# Patient Record
Sex: Male | Born: 1985 | Race: Black or African American | Hispanic: No | Marital: Married | Smoking: Never smoker
Health system: Southern US, Community
[De-identification: ages and names within clinical notes are randomized; demographics above are authoritative.]

---
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
## 2011-02-05 ENCOUNTER — Emergency Department (HOSPITAL_COMMUNITY)
Admission: EM | Admit: 2011-02-05 | Discharge: 2011-02-05 | Disposition: A | Discharge status: Home / Self Care | Payer: No Typology Code available for payment source | Attending: Emergency Medicine | Admitting: Emergency Medicine

## 2011-02-05 ENCOUNTER — Encounter (HOSPITAL_COMMUNITY): Payer: Self-pay | Admitting: *Deleted

## 2011-02-05 ENCOUNTER — Emergency Department (HOSPITAL_COMMUNITY): Payer: No Typology Code available for payment source

## 2011-02-05 DIAGNOSIS — T1490XA Injury, unspecified, initial encounter: Secondary | ICD-10-CM | POA: Insufficient documentation

## 2011-02-05 DIAGNOSIS — M542 Cervicalgia: Secondary | ICD-10-CM | POA: Insufficient documentation

## 2011-02-05 DIAGNOSIS — IMO0001 Reserved for inherently not codable concepts without codable children: Secondary | ICD-10-CM | POA: Insufficient documentation

## 2011-02-05 DIAGNOSIS — Y92410 Unspecified street and highway as the place of occurrence of the external cause: Secondary | ICD-10-CM | POA: Insufficient documentation

## 2011-02-05 MED ORDER — DIAZEPAM 5 MG PO TABS: 5.0000 mg | ORAL_TABLET | Freq: Three times a day (TID) | ORAL | Status: AC | PRN

## 2011-02-05 MED ORDER — IBUPROFEN 800 MG PO TABS
800.0000 mg | ORAL_TABLET | Freq: Once | ORAL | Status: AC
  Administered 2011-02-05: 800 mg via ORAL
  Filled 2011-02-05: qty 1

## 2011-02-05 MED ORDER — IBUPROFEN 800 MG PO TABS: 800.0000 mg | ORAL_TABLET | Freq: Three times a day (TID) | ORAL | Status: AC | PRN

## 2011-02-05 MED ORDER — OXYCODONE-ACETAMINOPHEN 5-325 MG PO TABS: 1.0000 | ORAL_TABLET | Freq: Four times a day (QID) | ORAL | Status: AC | PRN

## 2011-02-05 MED ORDER — OXYCODONE-ACETAMINOPHEN 5-325 MG PO TABS
1.0000 | ORAL_TABLET | Freq: Once | ORAL | Status: AC
  Administered 2011-02-05: 1 via ORAL
  Filled 2011-02-05: qty 1

## 2011-02-05 NOTE — ED Notes (Signed)
Patient verbalized complete understanding of d/c instructions
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
## 2011-02-05 NOTE — ED Provider Notes (Signed)
Medical screening examination/treatment/procedure(s) were performed by non-physician practitioner and as supervising physician I was immediately available for consultation/collaboration.   Andrew King, MD 02/05/11 2339 
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
## 2017-12-12 HISTORY — PX: WISDOM TOOTH EXTRACTION: SHX21

## 2017-12-31 ENCOUNTER — Encounter: Payer: Self-pay | Admitting: Family Medicine

## 2017-12-31 ENCOUNTER — Ambulatory Visit (INDEPENDENT_AMBULATORY_CARE_PROVIDER_SITE_OTHER): Payer: Managed Care, Other (non HMO) | Admitting: Family Medicine

## 2017-12-31 VITALS — BP 120/80 | HR 95 | Temp 98.0°F | Resp 12 | Ht 71.0 in | Wt 284.9 lb

## 2017-12-31 DIAGNOSIS — Z1159 Encounter for screening for other viral diseases: Secondary | ICD-10-CM | POA: Diagnosis not present

## 2017-12-31 DIAGNOSIS — Z6839 Body mass index (BMI) 39.0-39.9, adult: Secondary | ICD-10-CM

## 2017-12-31 DIAGNOSIS — Z13220 Encounter for screening for lipoid disorders: Secondary | ICD-10-CM

## 2017-12-31 DIAGNOSIS — E6609 Other obesity due to excess calories: Secondary | ICD-10-CM

## 2017-12-31 DIAGNOSIS — Z114 Encounter for screening for human immunodeficiency virus [HIV]: Secondary | ICD-10-CM

## 2017-12-31 DIAGNOSIS — Z23 Encounter for immunization: Secondary | ICD-10-CM
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
## 2018-01-02 ENCOUNTER — Encounter: Payer: Self-pay | Admitting: Family Medicine

## 2018-01-02 DIAGNOSIS — E66812 Obesity, class 2: Secondary | ICD-10-CM | POA: Insufficient documentation

## 2018-01-02 DIAGNOSIS — E785 Hyperlipidemia, unspecified: Secondary | ICD-10-CM | POA: Insufficient documentation

## 2018-01-02 DIAGNOSIS — Z6837 Body mass index (BMI) 37.0-37.9, adult: Secondary | ICD-10-CM | POA: Insufficient documentation

## 2018-01-02 DIAGNOSIS — E6609 Other obesity due to excess calories: Secondary | ICD-10-CM | POA: Insufficient documentation

## 2018-01-02 DIAGNOSIS — Z6839 Body mass index (BMI) 39.0-39.9, adult: Secondary | ICD-10-CM

## 2018-01-03 LAB — COMPLETE METABOLIC PANEL WITH GFR
AG RATIO: 1.5 (calc) (ref 1.0–2.5)
ALT: 17 U/L (ref 9–46)
AST: 16 U/L (ref 10–40)
Albumin: 4.5 g/dL (ref 3.6–5.1)
Alkaline phosphatase (APISO): 82 U/L (ref 40–115)
BUN: 16 mg/dL (ref 7–25)
CALCIUM: 10.2 mg/dL (ref 8.6–10.3)
CO2: 19 mmol/L — ABNORMAL LOW (ref 20–32)
Chloride: 104 mmol/L (ref 98–110)
Creat: 1.26 mg/dL (ref 0.60–1.35)
GFR, EST AFRICAN AMERICAN: 88 mL/min/{1.73_m2} (ref >=60)
GFR, EST NON AFRICAN AMERICAN: 76 mL/min/{1.73_m2} (ref >=60)
Globulin: 3.1 g/dL (calc) (ref 1.9–3.7)
Glucose, Bld: 83 mg/dL (ref 65–99)
POTASSIUM: 4.8 mmol/L (ref 3.5–5.3)
Sodium: 143 mmol/L (ref 135–146)
TOTAL PROTEIN: 7.6 g/dL (ref 6.1–8.1)
Total Bilirubin: 0.7 mg/dL (ref 0.2–1.2)

## 2018-01-03 LAB — LIPID PANEL
CHOL/HDL RATIO: 3.2 (calc) (ref <5.0)
Cholesterol: 204 mg/dL — ABNORMAL HIGH (ref <200)
HDL: 63 mg/dL (ref >40)
LDL CHOLESTEROL (CALC): 118 mg/dL — AB
NON-HDL CHOLESTEROL (CALC): 141 mg/dL — AB (ref <130)
TRIGLYCERIDES: 121 mg/dL (ref <150)

## 2018-01-03 LAB — HEPATITIS C ANTIBODY
Hepatitis C Ab: NONREACTIVE
SIGNAL TO CUT-OFF: 0.04 (ref <1.00)

## 2018-01-03 LAB — HIV ANTIBODY (ROUTINE TESTING W REFLEX): HIV: NONREACTIVE

## 2018-01-03 LAB — TEST AUTHORIZATION

## 2018-01-03 LAB — TSH: TSH: 0.76 mIU/L (ref 0.40–4.50)

## 2018-02-28 ENCOUNTER — Encounter: Payer: Managed Care, Other (non HMO) | Admitting: Family Medicine

## 2018-03-03 ENCOUNTER — Ambulatory Visit (INDEPENDENT_AMBULATORY_CARE_PROVIDER_SITE_OTHER): Payer: Managed Care, Other (non HMO) | Admitting: Family Medicine

## 2018-03-03 ENCOUNTER — Encounter: Payer: Self-pay | Admitting: Family Medicine

## 2018-03-03 VITALS — BP 120/80 | HR 85 | Temp 97.9°F | Resp 16 | Ht 71.0 in | Wt 272.8 lb

## 2018-03-03 DIAGNOSIS — Z0000 Encounter for general adult medical examination without abnormal findings: Secondary | ICD-10-CM | POA: Diagnosis not present

## 2018-03-03 NOTE — Progress Notes (Signed)
Name: Steven Braun   MRN: [id number]    DOB: [birth year]   Date:03/03/2018       Progress Note  Subjective  Chief Complaint  Chief Complaint  Patient presents with  . Annual Exam    HPI  Patient presents for annual CPE.  USPSTF grade A and B recommendations:  Diet: Eating low carb mostly, cheats sometimes, drinking more water.  Exercise: Going to [employer] in [place] and is down 14lbs.   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  Depression screen [facility] 2/9 03/03/2018 12/31/2017  Decreased Interest 0 0  Down, Depressed, Hopeless 1 1  PHQ - 2 Score 1 1  Altered sleeping 0 0  Tired, decreased energy 0 0  Change in appetite 0 1  Feeling bad or failure about yourself  0 1  Trouble concentrating 0 0  Moving slowly or fidgety/restless 0 0  Suicidal thoughts - 0  PHQ-9 Score 1 3  Difficult doing work/chores Somewhat difficult Not difficult at all     Office Visit from 03/03/2018 in [facility]  AUDIT-C Score  2    1-2 times a month at most.  Hypertension:  BP Readings from Last 3 Encounters:  03/03/18 120/80  12/31/17 120/80  02/05/11 147/81   Obesity: Wt Readings from Last 3 Encounters:  03/03/18 272 lb 12.8 oz (123.7 kg)  12/31/17 284 lb 14.4 oz (129.2 kg)  02/05/11 190 lb (86.2 kg)   BMI Readings from Last 3 Encounters:  03/03/18 38.05 kg/m  12/31/17 39.74 kg/m  02/05/11 25.07 kg/m    Lipids:  Lab Results  Component Value Date   CHOL 204 (H) 12/31/2017   Lab Results  Component Value Date   HDL 63 12/31/2017   Lab Results  Component Value Date   LDLCALC 118 (H) 12/31/2017   Lab Results  Component Value Date   TRIG 121 12/31/2017   Lab Results  Component Value Date   CHOLHDL 3.2 12/31/2017   No results found for: LDLDIRECT Glucose:  Glucose, Bld  Date Value Ref Range Status  12/31/2017 83 65 - 99 mg/dL Final    Comment:    .            Fasting reference interval .     Married STD testing and prevention (HIV/chl/gon/syphilis): Hep C and HIV was negative in 2019.  Declines gonorrhea/chlamydia testing.   Hep C: Negative in 2019  Skin cancer: No concerning lesions Colorectal cancer: Denies family or personal history of colorectal cancer, no changes in BM's - no blood in stool, dark and tarry stool, mucus in stool, or constipation/diarrhea. Prostate cancer: No known family history. Discussed signs and symptoms of enlarged prostate, he denies symptoms.   No results found for: PSA  Lung cancer: Never smoker.  Low Dose CT Chest recommended if Age 36-80 years, 30 pack-year currently smoking OR have quit w/in 15years. Patient does not qualify.   AAA: The USPSTF recommends one-time screening with ultrasonography in men ages 72 to 32 years who have ever smoked ECG:  Denies chest pain, shortness of breath, palpitations.   Advanced Care Planning: A voluntary discussion about advance care planning including the explanation and discussion of advance directives.  Discussed health care proxy and Living will, and the patient was able to identify a health care proxy as Alejandro Mulling.  Patient does not have a living will at present time. If patient does have living will, I  have requested they bring this to the clinic to be scanned in to their chart.  Patient Active Problem List   Diagnosis Date Noted  . Hyperlipidemia 01/02/2018  . Class 2 obesity due to excess calories without serious comorbidity with body mass index (BMI) of 39.0 to 39.9 in adult 01/02/2018    Past Surgical History:  Procedure Laterality Date  . WISDOM TOOTH EXTRACTION  12/12/2017    Family History  Problem Relation Age of Onset  . Breast cancer Mother 59       Unsure if genetic testing was done  . Congestive Heart Failure Paternal Grandmother     Social History   Socioeconomic History  . Marital status: Married    Spouse name: Paris  . Number of children: Not on file  . Years of education:  Bachelor's  . Highest education level: Bachelor's degree (e.g., BA, AB, BS)  Occupational History  . Not on file  Social Needs  . Financial resource strain: Not on file  . Food insecurity:    Worry: Not on file    Inability: Not on file  . Transportation needs:    Medical: Not on file    Non-medical: Not on file  Tobacco Use  . Smoking status: Never Smoker  . Smokeless tobacco: Never Used  Substance and Sexual Activity  . Alcohol use: Yes    Comment: 1-2 times a month  . Drug use: No  . Sexual activity: Yes    Partners: Female  Lifestyle  . Physical activity:    Days per week: Not on file    Minutes per session: Not on file  . Stress: Not on file  Relationships  . Social connections:    Talks on phone: Not on file    Gets together: Not on file    Attends religious service: Not on file    Active member of club or organization: Not on file    Attends meetings of clubs or organizations: Not on file    Relationship status: Not on file  . Intimate partner violence:    Fear of current or ex partner: Not on file    Emotionally abused: Not on file    Physically abused: Not on file    Forced sexual activity: Not on file  Other Topics Concern  . 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      Current Outpatient Medications:  .  acetaminophen (TYLENOL) 500 MG tablet, Take 500 mg by mouth every 6 (six) hours as needed. For pain relef, Disp: , Rfl:  .  DM-Phenylephrine-Acetaminophen (TYLENOL COLD MULTI-SYMPTOM) 10-5-325 MG/15ML LIQD, Take 15 mLs by mouth every 6 (six) hours as needed. For symptom relief, Disp: , Rfl:   No Known Allergies   ROS  Constitutional: Negative for fever or weight change.  Respiratory: Negative for cough and shortness of breath.   Cardiovascular: Negative for chest pain or palpitations.  Gastrointestinal: Negative for abdominal pain, no bowel changes.  Musculoskeletal: Negative for gait problem or  joint swelling.  Skin: Negative for rash.  Neurological: Negative for dizziness or headache.  No other specific complaints in a complete review of systems (except as listed in HPI above.   Objective  Vitals:   03/03/18 1050  BP: 120/80  Pulse: 85  Resp: 16  Temp: 97.9 F (36.6 C)  TempSrc: Oral  SpO2: 98%  Weight: 272 lb 12.8 oz (123.7 kg)  Height: 5\' 11"  (1.803  m)    Body mass index is 38.05 kg/m.  Physical Exam  Constitutional: Patient appears well-developed and well-nourished. No distress.  HENT: Head: Normocephalic and atraumatic. Ears: B TMs ok, no erythema or effusion; Nose: Nose normal. Mouth/Throat: Oropharynx is clear and moist. No oropharyngeal exudate.  Eyes: Conjunctivae and EOM are normal. Pupils are equal, round, and reactive to light. No scleral icterus.  Neck: Normal range of motion. Neck supple. No JVD present. No thyromegaly present.  Cardiovascular: Normal rate, regular rhythm and normal heart sounds.  No murmur heard. No BLE edema. Pulmonary/Chest: Effort normal and breath sounds normal. No respiratory distress. Abdominal: Soft. Bowel sounds are normal, no distension. There is no tenderness. no masses MALE GENITALIA: Deferred RECTAL: Deferred Musculoskeletal: Normal range of motion, no joint effusions. No gross deformities Neurological: he is alert and oriented to person, place, and time. No cranial nerve deficit. Coordination, balance, strength, speech and gait are normal.  Skin: Skin is warm and dry. No rash noted. No erythema.  Psychiatric: Patient has a normal mood and affect. behavior is normal. Judgment and thought content normal.  Recent Results (from the past 2160 hour(s))  HIV Antibody (routine testing w rflx)     Status: None   Collection Time: 12/31/17  9:13 AM  Result Value Ref Range   HIV 1&2 Ab, 4th Generation NON-REACTIVE NON-REACTI    Comment: HIV-1 antigen and HIV-1/HIV-2 antibodies were not detected. There is no laboratory evidence  of HIV infection. Marland Kitchen PLEASE NOTE: This information has been disclosed to you from records whose confidentiality may be protected by state law.  If your state requires such protection, then the state law prohibits you from making any further disclosure of the information without the specific written consent of the person to whom it pertains, or as otherwise permitted by law. A general authorization for the release of medical or other information is NOT sufficient for this purpose. . For additional information please refer to http://education.[facility].com/faq/FAQ106 (This link is being provided for informational/ educational purposes only.) . Marland Kitchen The performance of this assay has not been clinically validated in patients less than 33 years old. .   Hepatitis C antibody     Status: None   Collection Time: 12/31/17  9:13 AM  Result Value Ref Range   Hepatitis C Ab NON-REACTIVE NON-REACTI   SIGNAL TO CUT-OFF 0.04 <1.00    Comment: . HCV antibody was non-reactive. There is no laboratory  evidence of HCV infection. . In most cases, no further action is required. However, if recent HCV exposure is suspected, a test for HCV RNA (test code 74128) is suggested. . For additional information please refer to http://education.[facility].com/faq/FAQ22v1 (This link is being provided for informational/ educational purposes only.) .   Lipid panel     Status: Abnormal   Collection Time: 12/31/17  9:13 AM  Result Value Ref Range   Cholesterol 204 (H) <200 mg/dL   HDL 63 >78 mg/dL   Triglycerides 676 <720 mg/dL   LDL Cholesterol (Calc) 118 (H) mg/dL (calc)    Comment: Reference range: <100 . Desirable range <100 mg/dL for primary prevention;   <70 mg/dL for patients with CHD or diabetic patients  with > or = 2 CHD risk factors. Marland Kitchen LDL-C is now calculated using the Martin-Hopkins  calculation, which is a validated novel method providing  better accuracy than the Friedewald  equation in the  estimation of LDL-C.  Horald Pollen et al. Lenox Ahr. 9470;962(83): 2061-2068  (http://education.[facility].com/faq/FAQ164)    Total CHOL/HDL  Ratio 3.2 <5.0 (calc)   Non-HDL Cholesterol (Calc) 141 (H) <130 mg/dL (calc)    Comment: For patients with diabetes plus 1 major ASCVD risk  factor, treating to a non-HDL-C goal of <100 mg/dL  (LDL-C of <95 mg/dL) is considered a therapeutic  option.   TSH     Status: None   Collection Time: 12/31/17  9:13 AM  Result Value Ref Range   TSH 0.76 0.40 - 4.50 mIU/L  COMPLETE METABOLIC PANEL WITH GFR     Status: Abnormal   Collection Time: 12/31/17  9:13 AM  Result Value Ref Range   Glucose, Bld 83 65 - 99 mg/dL    Comment: .            Fasting reference interval .    BUN 16 7 - 25 mg/dL   Creat 2.84 1.32 - 4.40 mg/dL   GFR, Est Non African American 76 > OR = 60 mL/min/1.73m2   GFR, Est African American 88 > OR = 60 mL/min/1.41m2   BUN/Creatinine Ratio NOT APPLICABLE 6 - 22 (calc)   Sodium 143 135 - 146 mmol/L   Potassium 4.8 3.5 - 5.3 mmol/L   Chloride 104 98 - 110 mmol/L   CO2 19 (L) 20 - 32 mmol/L   Calcium 10.2 8.6 - 10.3 mg/dL   Total Protein 7.6 6.1 - 8.1 g/dL   Albumin 4.5 3.6 - 5.1 g/dL   Globulin 3.1 1.9 - 3.7 g/dL (calc)   AG Ratio 1.5 1.0 - 2.5 (calc)   Total Bilirubin 0.7 0.2 - 1.2 mg/dL   Alkaline phosphatase (APISO) 82 40 - 115 U/L   AST 16 10 - 40 U/L   ALT 17 9 - 46 U/L  TEST AUTHORIZATION     Status: None   Collection Time: 12/31/17  9:13 AM  Result Value Ref Range   TEST NAME: COMPREHENSIVE METABOLIC    TEST CODE: [id number]    CLIENT CONTACT: LESLIE SMITH    REPORT ALWAYS MESSAGE SIGNATURE      Comment: . The laboratory testing on this patient was verbally requested or confirmed by the ordering physician or his or her authorized representative after contact with an employee of [employer]. Federal regulations require that we maintain on file written authorization for all laboratory testing.   Accordingly we are asking that the ordering physician or his or her authorized representative sign a copy of this report and promptly return it to the client service representative. . . Signature:____________________________________________________ . Please fax this signed page to [phone] or return it via your [employer] courier.      PHQ2/9: Depression screen [facility] 2/9 03/03/2018 12/31/2017  Decreased Interest 0 0  Down, Depressed, Hopeless 1 1  PHQ - 2 Score 1 1  Altered sleeping 0 0  Tired, decreased energy 0 0  Change in appetite 0 1  Feeling bad or failure about yourself  0 1  Trouble concentrating 0 0  Moving slowly or fidgety/restless 0 0  Suicidal thoughts - 0  PHQ-9 Score 1 3  Difficult doing work/chores Somewhat difficult Not difficult at all    Fall Risk: Fall Risk  03/03/2018 03/03/2018 12/31/2017  Falls in the past year? 0 0 0  Number falls in past yr: 0 0 0  Injury with Fall? 0 0 0    Functional Status Survey: Is the patient deaf or have difficulty hearing?: No Does the patient have difficulty seeing, even when wearing glasses/contacts?: No Does the patient have difficulty concentrating,  remembering, or making decisions?: No Does the patient have difficulty walking or climbing stairs?: No Does the patient have difficulty dressing or bathing?: No Does the patient have difficulty doing errands alone such as visiting a doctor's office or shopping?: No  Assessment & Plan  1. Annual physical exam -Prostate cancer screening and PSA options (with potential risks and benefits of testing vs not testing) were discussed along with recent recs/guidelines. -USPSTF grade A and B recommendations reviewed with patient; age-appropriate recommendations, preventive care, screening tests, etc discussed and encouraged; healthy living encouraged; see AVS for patient education given to patient -Discussed importance of 150 minutes of physical activity weekly, eat two  servings of fish weekly, eat one serving of tree nuts ( cashews, pistachios, pecans, almonds.Marland Kitchen) every other day, eat 6 servings of fruit/vegetables daily and drink plenty of water and avoid sweet beverages.

## 2018-03-03 NOTE — Patient Instructions (Signed)
Preventive Care 18-39 Years, Male Preventive care refers to lifestyle choices and visits with your health care provider that can promote health and wellness. What does preventive care include?   A yearly physical exam. This is also called an annual well check.  Dental exams once or twice a year.  Routine eye exams. Ask your health care provider how often you should have your eyes checked.  Personal lifestyle choices, including: ? Daily care of your teeth and gums. ? Regular physical activity. ? Eating a healthy diet. ? Avoiding tobacco and drug use. ? Limiting alcohol use. ? Practicing safe sex. What happens during an annual well check? The services and screenings done by your health care provider during your annual well check will depend on your age, overall health, lifestyle risk factors, and family history of disease. Counseling Your health care provider may ask you questions about your:  Alcohol use.  Tobacco use.  Drug use.  Emotional well-being.  Home and relationship well-being.  Sexual activity.  Eating habits.  Work and work environment. Screening You may have the following tests or measurements:  Height, weight, and BMI.  Blood pressure.  Lipid and cholesterol levels. These may be checked every 5 years starting at age 20.  Diabetes screening. This is done by checking your blood sugar (glucose) after you have not eaten for a while (fasting).  Skin check.  Hepatitis C blood test.  Hepatitis B blood test.  Sexually transmitted disease (STD) testing. Discuss your test results, treatment options, and if necessary, the need for more tests with your health care provider. Vaccines Your health care provider may recommend certain vaccines, such as:  Influenza vaccine. This is recommended every year.  Tetanus, diphtheria, and acellular pertussis (Tdap, Td) vaccine. You may need a Td booster every 10 years.  Varicella vaccine. You may need this if you  have not been vaccinated.  HPV vaccine. If you are 26 or younger, you may need three doses over 6 months.  Measles, mumps, and rubella (MMR) vaccine. You may need at least one dose of MMR.You may also need a second dose.  Pneumococcal 13-valent conjugate (PCV13) vaccine. You may need this if you have certain conditions and have not been vaccinated.  Pneumococcal polysaccharide (PPSV23) vaccine. You may need one or two doses if you smoke cigarettes or if you have certain conditions.  Meningococcal vaccine. One dose is recommended if you are age 19-21 years and a first-year college student living in a residence hall, or if you have one of several medical conditions. You may also need additional booster doses.  Hepatitis A vaccine. You may need this if you have certain conditions or if you travel or work in places where you may be exposed to hepatitis A.  Hepatitis B vaccine. You may need this if you have certain conditions or if you travel or work in places where you may be exposed to hepatitis B.  Haemophilus influenzae type b (Hib) vaccine. You may need this if you have certain risk factors. Talk to your health care provider about which screenings and vaccines you need and how often you need them. This information is not intended to replace advice given to you by your health care provider. Make sure you discuss any questions you have with your health care provider. Document Released: 02/13/2001 Document Revised: 07/31/2016 Document Reviewed: 10/19/2014 Elsevier Interactive Patient Education  2019 [employer]   

## 2018-07-01 ENCOUNTER — Other Ambulatory Visit: Payer: Self-pay | Admitting: *Deleted

## 2018-07-01 DIAGNOSIS — Z20822 Contact with and (suspected) exposure to covid-19: Secondary | ICD-10-CM

## 2018-07-06 LAB — NOVEL CORONAVIRUS, NAA: SARS-CoV-2, NAA: NOT DETECTED

## 2018-07-28 ENCOUNTER — Other Ambulatory Visit: Payer: Self-pay | Admitting: *Deleted

## 2018-07-28 DIAGNOSIS — Z20822 Contact with and (suspected) exposure to covid-19: Secondary | ICD-10-CM

## 2018-08-01 LAB — NOVEL CORONAVIRUS, NAA: SARS-CoV-2, NAA: NOT DETECTED

## 2018-08-12 ENCOUNTER — Other Ambulatory Visit: Payer: Self-pay | Admitting: *Deleted

## 2018-08-12 DIAGNOSIS — Z20822 Contact with and (suspected) exposure to covid-19: Secondary | ICD-10-CM

## 2018-08-14 LAB — NOVEL CORONAVIRUS, NAA: SARS-CoV-2, NAA: NOT DETECTED

## 2019-01-23 ENCOUNTER — Other Ambulatory Visit: Payer: Self-pay

## 2019-03-04 ENCOUNTER — Other Ambulatory Visit: Payer: Self-pay

## 2019-03-04 ENCOUNTER — Ambulatory Visit (INDEPENDENT_AMBULATORY_CARE_PROVIDER_SITE_OTHER): Payer: Managed Care, Other (non HMO) | Admitting: Internal Medicine

## 2019-03-04 ENCOUNTER — Encounter: Payer: Self-pay | Admitting: Internal Medicine

## 2019-03-04 VITALS — BP 100/60 | HR 98 | Temp 96.9°F | Resp 16 | Ht 73.0 in | Wt 287.5 lb

## 2019-03-04 DIAGNOSIS — Z6837 Body mass index (BMI) 37.0-37.9, adult: Secondary | ICD-10-CM

## 2019-03-04 DIAGNOSIS — E6609 Other obesity due to excess calories: Secondary | ICD-10-CM

## 2019-03-04 DIAGNOSIS — E782 Mixed hyperlipidemia: Secondary | ICD-10-CM | POA: Diagnosis not present

## 2019-03-04 NOTE — Progress Notes (Signed)
Name: Steven Braun   MRN: [id number]    DOB: February 25, 1985   Date:03/04/2019       Progress Note  Subjective  Chief Complaint  Chief Complaint  Patient presents with  . Annual Exam    HPI  Patient presents for annual CPE   USPSTF grade A and B recommendations:  Diet:   notes tries to watch and eat healthy, not adherent to a very strict specific diet, watching carbs  Exercise:  notes + regular exercise, works out in am 5 days a week, loss 20 pounds in past couple months, very active with landscaping  Depression: phq 9 is negative Depression screen [facility] 2/9 03/04/2019 03/03/2018 12/31/2017  Decreased Interest 0 0 0  Down, Depressed, Hopeless 0 1 1  PHQ - 2 Score 0 1 1  Altered sleeping 0 0 0  Tired, decreased energy 0 0 0  Change in appetite 0 0 1  Feeling bad or failure about yourself  0 0 1  Trouble concentrating 0 0 0  Moving slowly or fidgety/restless 0 0 0  Suicidal thoughts 0 - 0  PHQ-9 Score 0 1 3  Difficult doing work/chores Not difficult at all Somewhat difficult Not difficult at all    Hypertension:  BP Readings from Last 3 Encounters:  03/04/19 100/60  03/03/18 120/80  12/31/17 120/80    Obesity: Wt Readings from Last 3 Encounters:  03/04/19 287 lb 8 oz (130.4 kg)  03/03/18 272 lb 12.8 oz (123.7 kg)  12/31/17 284 lb 14.4 oz (129.2 kg)   BMI Readings from Last 3 Encounters:  03/04/19 37.93 kg/m  03/03/18 38.05 kg/m  12/31/17 39.74 kg/m     Lipids:  Lab Results  Component Value Date   CHOL 204 (H) 12/31/2017   Lab Results  Component Value Date   HDL 63 12/31/2017   Lab Results  Component Value Date   LDLCALC 118 (H) 12/31/2017   Lab Results  Component Value Date   TRIG 121 12/31/2017   Lab Results  Component Value Date   CHOLHDL 3.2 12/31/2017   No results found for: LDLDIRECT  Glucose:  Glucose, Bld  Date Value Ref Range Status  12/31/2017 83 65 - 99 mg/dL Final    Comment:    .            Fasting reference interval .      Office Visit from 03/04/2019 in [facility]  AUDIT-C Score  2     Alcohol - once a month Tob - never smoked  Married STD testing and prevention (HIV/chl/gon/syphilis): Hep C and HIV negative 2019, declined testing Hep C: as above  Skin cancer: Discussed monitoring for atypical lesions, following ABCDE's, no new rashes or lesions of  concern Colorectal cancer: Denies family or personal history of colorectal cancer, no changes in BM's - no blood in stool, dark and tarry stool Prostate cancer: no sx's of cocnern    Lung cancer:   Low Dose CT Chest recommended if Age 85-80 years, 30 pack-year currently smoking OR have quit w/in 15years. Patient does not qualify.   AAA: N/A  - The USPSTF recommends one-time screening with ultrasonography in men ages 46 to 90 years who have ever smoked ECG:   No concerning sx's, no chest pain, palpitations, shortness of breath  Advanced Care Planning: A voluntary discussion about advance care planning including the explanation and discussion of advance directives.  Discussed health care proxy and Living will, and the patient was able to  identify a health care proxy as Danielle Dess, his  wife.  Patient does not have a living will at present time. If patient does have living will, I have requested they bring this to the clinic to be scanned in to their chart.  Patient Active Problem List   Diagnosis Date Noted  . Hyperlipidemia 01/02/2018  . Class 2 obesity due to excess calories without serious comorbidity with body mass index (BMI) of 39.0 to 39.9 in adult 01/02/2018    Past Surgical History:  Procedure Laterality Date  . WISDOM TOOTH EXTRACTION  12/12/2017    Family History  Problem Relation Age of Onset  . Breast cancer Mother 19       Unsure if genetic testing was done  . Congestive Heart Failure Paternal Grandmother     Social History   Socioeconomic History  . Marital status: Married    Spouse name: Paris  . Number of children:  Not on file  . Years of education: Bachelor's  . Highest education level: Bachelor's degree (e.g., BA, AB, BS)  Occupational History  . Not on file  Tobacco Use  . Smoking status: Never Smoker  . Smokeless tobacco: Never Used  Substance and Sexual Activity  . Alcohol use: Yes    Comment: 1-2 times a month  . Drug use: No  . Sexual activity: Yes    Partners: Female  Other Topics Concern  . 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    Social Determinants of Health   Financial Resource Strain:   . Difficulty of Paying Living Expenses: Not on file  Food Insecurity:   . Worried About Programme researcher, broadcasting/film/video in the Last Year: Not on file  . Ran Out of Food in the Last Year: Not on file  Transportation Needs:   . Lack of Transportation (Medical): Not on file  . Lack of Transportation (Non-Medical): Not on file  Physical Activity:   . Days of Exercise per Week: Not on file  . Minutes of Exercise per Session: Not on file  Stress:   . Feeling of Stress : Not on file  Social Connections:   . Frequency of Communication with Friends and Family: Not on file  . Frequency of Social Gatherings with Friends and Family: Not on file  . Attends Religious Services: Not on file  . Active Member of Clubs or Organizations: Not on file  . Attends Banker Meetings: Not on file  . Marital Status: Not on file  Intimate Partner Violence:   . Fear of Current or Ex-Partner: Not on file  . Emotionally Abused: Not on file  . Physically Abused: Not on file  . Sexually Abused: Not on file     Current Outpatient Medications:  .  acetaminophen (TYLENOL) 500 MG tablet, Take 500 mg by mouth every 6 (six) hours as needed. For pain relef, Disp: , Rfl:  .  Cetirizine HCl (ZYRTEC ALLERGY) 10 MG CAPS, Take by mouth daily., Disp: , Rfl:   No Known Allergies   ROS: As noted above in HPI denies any recent increased fatigue, tired presently  No  recent fevers or other Covid concerning sx's No increase headaches, vision changes No CP, palpitations,  No increased SOB,  No increased cough,  No persistent abdominal pains or change in bowel habits,  No rectal bleeding or dark/black stools,  No flank pains or dysuria,  No frequency or urgency No lower extremity  swelling,  No increase joint aches or muscle aches,  No numbness, tingling or weakness in the extremities Denies other specific complaints on systems review except as noted in HPI    Objective  Vitals:   03/04/19 0756  BP: 100/60  Pulse: 98  Resp: 16  Temp: (!) 96.9 F (36.1 C)  TempSrc: Temporal  SpO2: 98%  Weight: 287 lb 8 oz (130.4 kg)  Height: 6\' 1"  (1.854 m)    Body mass index is 37.93 kg/m.  NAD, masked HEENT - sclera anicteric, PERRL, EOMI, conj - non-inj'ed, No sinus tenderness, TM's and canals clear, pharynx clear Neck - supple, no adenopathy, no TM, no JVD, carotids 2+ and = without bruits bilat Car - RRR without m/g/r Pulm- CTA without wheeze or rales Abd - soft, NT, ND, BS+, no obvious HSM, no masses Back - no CVA tenderness, no spinal tenderness Skin- no new lesions of concern on exposed areas, denied otherwise Ext - no LE edema, no active joints GU - no swelling in inguinal/suprapubic region, NT  Normal descended testes bilaterally, no masses palpated, no hernias, no lesions, no discharge Rectal: not done, prostate exam deferred as no sx's of concern Neuro - affect was not flat, appropriate with conversation  Grossly non-focal with good strength on testing, sensation intact to LT in distal extremities, Romberg neg, no pronator drift, good balance on one foot, good finger to nose, good RAMs, good tandem walk  Labs from about a year ago reviewed (12/2017)  PHQ2/9: Depression screen [facility] 2/9 03/04/2019 03/03/2018 12/31/2017  Decreased Interest 0 0 0  Down, Depressed, Hopeless 0 1 1  PHQ - 2 Score 0 1 1  Altered sleeping 0 0 0  Tired, decreased  energy 0 0 0  Change in appetite 0 0 1  Feeling bad or failure about yourself  0 0 1  Trouble concentrating 0 0 0  Moving slowly or fidgety/restless 0 0 0  Suicidal thoughts 0 - 0  PHQ-9 Score 0 1 3  Difficult doing work/chores Not difficult at all Somewhat difficult Not difficult at all   phq9 neg  Fall Risk: Fall Risk  03/04/2019 03/03/2018 03/03/2018 12/31/2017  Falls in the past year? 0 0 0 0  Number falls in past yr: 0 0 0 0  Injury with Fall? 0 0 0 0     Functional Status Survey: Is the patient deaf or have difficulty hearing?: No Does the patient have difficulty seeing, even when wearing glasses/contacts?: No Does the patient have difficulty concentrating, remembering, or making decisions?: No Does the patient have difficulty walking or climbing stairs?: No Does the patient have difficulty dressing or bathing?: No Does the patient have difficulty doing errands alone such as visiting a doctor's office or shopping?: No  Health Maintenance  Topic Date Due  . INFLUENZA VACCINE  08/02/2018  . TETANUS/TDAP  01/01/2028  . HIV Screening  Completed     Assessment & Plan  1. General Health/Health Maintenance:   -USPSTF grade A and B recommendations reviewed with patient; age-appropriate recommendations, preventive care, screening tests, etc discussed and encouraged; healthy living encouraged; see AVS for any further patient education given to patient  -Discussed importance of continuing regular physical activity weekly, is doing presently   -Discussed importance of eating healthy diet:  avoid simple sugars and excessive carbs in diet, eating servings of fruit/vegetables daily and staying hydrated including drink plenty of water and avoid sweet beverages.   -Reviewed Health Maintenance   2. Mixed hyperlipidemia  Agreed to recheck the lipid panel this year and reassess.  Discussed goals, and medications that are sometimes added pending results. - Lipid panel - COMPLETE  METABOLIC PANEL WITH GFR  2. Class 2 obesity due to excess calories without serious comorbidity with body mass index (BMI) of 37.0 to 37.9 in adult Discussed importance of weight control and weight management.  He has had success losing weight again, and will continue with efforts and to monitor. - COMPLETE METABOLIC PANEL WITH GFR

## 2019-03-04 NOTE — Patient Instructions (Signed)
High Cholesterol  High cholesterol is a condition in which the blood has high levels of a white, waxy, fat-like substance (cholesterol). The human body needs small amounts of cholesterol. The liver makes all the cholesterol that the body needs. Extra (excess) cholesterol comes from the food that we eat. Cholesterol is carried from the liver by the blood through the blood vessels. If you have high cholesterol, deposits (plaques) may build up on the walls of your blood vessels (arteries). Plaques make the arteries narrower and stiffer. Cholesterol plaques increase your risk for heart attack and stroke. Work with your health care provider to keep your cholesterol levels in a healthy range. What increases the risk? This condition is more likely to develop in people who:  Eat foods that are high in animal fat (saturated fat) or cholesterol.  Are overweight.  Are not getting enough exercise.  Have a family history of high cholesterol. What are the signs or symptoms? There are no symptoms of this condition. How is this diagnosed? This condition may be diagnosed from the results of a blood test.  If you are older than age 46, your health care provider may check your cholesterol every 4-6 years.  You may be checked more often if you already have high cholesterol or other risk factors for heart disease. The blood test for cholesterol measures:  "Bad" cholesterol (LDL cholesterol). This is the main type of cholesterol that causes heart disease. The desired level for LDL is less than 100.  "Good" cholesterol (HDL cholesterol). This type helps to protect against heart disease by cleaning the arteries and carrying the LDL away. The desired level for HDL is 60 or higher.  Triglycerides. These are fats that the body can store or burn for energy. The desired number for triglycerides is lower than 150.  Total cholesterol. This is a measure of the total amount of cholesterol in your blood, including LDL  cholesterol, HDL cholesterol, and triglycerides. A healthy number is less than 200. How is this treated? This condition is treated with diet changes, lifestyle changes, and medicines. Diet changes  This may include eating more whole grains, fruits, vegetables, nuts, and fish.  This may also include cutting back on red meat and foods that have a lot of added sugar. Lifestyle changes  Changes may include getting at least 40 minutes of aerobic exercise 3 times a week. Aerobic exercises include walking, biking, and swimming. Aerobic exercise along with a healthy diet can help you maintain a healthy weight.  Changes may also include quitting smoking. Medicines  Medicines are usually given if diet and lifestyle changes have failed to reduce your cholesterol to healthy levels.  Your health care provider may prescribe a statin medicine. Statin medicines have been shown to reduce cholesterol, which can reduce the risk of heart disease. Follow these instructions at home: Eating and drinking If told by your health care provider:  Eat chicken (without skin), fish, veal, shellfish, ground [place] breast, and round or loin cuts of red meat.  Do not eat fried foods or fatty meats, such as hot dogs and salami.  Eat plenty of fruits, such as apples.  Eat plenty of vegetables, such as broccoli, potatoes, and carrots.  Eat beans, peas, and lentils.  Eat grains such as barley, rice, couscous, and bulgur wheat.  Eat pasta without cream sauces.  Use skim or nonfat milk, and eat low-fat or nonfat yogurt and cheeses.  Do not eat or drink whole milk, cream, ice cream, egg yolks,  or hard cheeses.  Do not eat stick margarine or tub margarines that contain trans fats (also called partially hydrogenated oils).  Do not eat saturated tropical oils, such as coconut oil and palm oil.  Do not eat cakes, cookies, crackers, or other baked goods that contain trans fats.  General instructions  Exercise as  directed by your health care provider. Increase your activity level with activities such as gardening, walking, and taking the stairs.  Take over-the-counter and prescription medicines only as told by your health care provider.  Do not use any products that contain nicotine or tobacco, such as cigarettes and e-cigarettes. If you need help quitting, ask your health care provider.  Keep all follow-up visits as told by your health care provider. This is important. Contact a health care provider if:  You are struggling to maintain a healthy diet or weight.  You need help to start on an exercise program.  You need help to stop smoking. Get help right away if:  You have chest pain.  You have trouble breathing. This information is not intended to replace advice given to you by your health care provider. Make sure you discuss any questions you have with your health care provider. Document Revised: 12/21/2016 Document Reviewed: 06/18/2015 Elsevier Patient Education  2020 [place] 69-56 Years Old, Male Preventive care refers to lifestyle choices and visits with your health care provider that can promote health and wellness. This includes:  A yearly physical exam. This is also called an annual well check.  Regular dental and eye exams.  Immunizations.  Screening for certain conditions.  Healthy lifestyle choices, such as eating a healthy diet, getting regular exercise, not using drugs or products that contain nicotine and tobacco, and limiting alcohol use. What can I expect for my preventive care visit? Physical exam Your health care provider will check:  Height and weight. These may be used to calculate body mass index (BMI), which is a measurement that tells if you are at a healthy weight.  Heart rate and blood pressure.  Your skin for abnormal spots. Counseling Your health care provider may ask you questions about:  Alcohol, tobacco, and drug  use.  Emotional well-being.  Home and relationship well-being.  Sexual activity.  Eating habits.  Work and work Statistician. What immunizations do I need?  Influenza (flu) vaccine  This is recommended every year. Tetanus, diphtheria, and pertussis (Tdap) vaccine  You may need a Td booster every 10 years. Varicella (chickenpox) vaccine  You may need this vaccine if you have not already been vaccinated. Human papillomavirus (HPV) vaccine  If recommended by your health care provider, you may need three doses over 6 months. Measles, mumps, and rubella (MMR) vaccine  You may need at least one dose of MMR. You may also need a second dose. Meningococcal conjugate (MenACWY) vaccine  One dose is recommended if you are 33-56 years old and a Market researcher living in a residence hall, or if you have one of several medical conditions. You may also need additional booster doses. Pneumococcal conjugate (PCV13) vaccine  You may need this if you have certain conditions and were not previously vaccinated. Pneumococcal polysaccharide (PPSV23) vaccine  You may need one or two doses if you smoke cigarettes or if you have certain conditions. Hepatitis A vaccine  You may need this if you have certain conditions or if you travel or work in places where you may be exposed to hepatitis A. Hepatitis B vaccine  You may need this if you have certain conditions or if you travel or work in places where you may be exposed to hepatitis B. Haemophilus influenzae type b (Hib) vaccine  You may need this if you have certain risk factors. You may receive vaccines as individual doses or as more than one vaccine together in one shot (combination vaccines). Talk with your health care provider about the risks and benefits of combination vaccines. What tests do I need? Blood tests  Lipid and cholesterol levels. These may be checked every 5 years starting at age 44.  Hepatitis C  test.  Hepatitis B test. Screening   Diabetes screening. This is done by checking your blood sugar (glucose) after you have not eaten for a while (fasting).  Sexually transmitted disease (STD) testing. Talk with your health care provider about your test results, treatment options, and if necessary, the need for more tests. Follow these instructions at home: Eating and drinking   Eat a diet that includes fresh fruits and vegetables, whole grains, lean protein, and low-fat dairy products.  Take vitamin and mineral supplements as recommended by your health care provider.  Do not drink alcohol if your health care provider tells you not to drink.  If you drink alcohol: ? Limit how much you have to 0-2 drinks a day. ? Be aware of how much alcohol is in your drink. In the [place] one drink equals one 12 oz bottle of beer (355 mL), one 5 oz glass of wine (148 mL), or one 1 oz glass of hard liquor (44 mL). Lifestyle  Take daily care of your teeth and gums.  Stay active. Exercise for at least 30 minutes on 5 or more days each week.  Do not use any products that contain nicotine or tobacco, such as cigarettes, e-cigarettes, and chewing tobacco. If you need help quitting, ask your health care provider.  If you are sexually active, practice safe sex. Use a condom or other form of protection to prevent STIs (sexually transmitted infections). What's next?  Go to your health care provider once a year for a well check visit.  Ask your health care provider how often you should have your eyes and teeth checked.  Stay up to date on all vaccines. This information is not intended to replace advice given to you by your health care provider. Make sure you discuss any questions you have with your health care provider. Document Revised: 12/12/2017 Document Reviewed: 12/12/2017 Elsevier Patient Education  2020 [employer].

## 2019-03-05 LAB — COMPLETE METABOLIC PANEL WITH GFR
AG Ratio: 1.7 (calc) (ref 1.0–2.5)
ALT: 19 U/L (ref 9–46)
AST: 16 U/L (ref 10–40)
Albumin: 4.4 g/dL (ref 3.6–5.1)
Alkaline phosphatase (APISO): 84 U/L (ref 36–130)
BUN: 16 mg/dL (ref 7–25)
CO2: 27 mmol/L (ref 20–32)
Calcium: 9.9 mg/dL (ref 8.6–10.3)
Chloride: 105 mmol/L (ref 98–110)
Creat: 1.2 mg/dL (ref 0.60–1.35)
GFR, Est African American: 93 mL/min/{1.73_m2} (ref >=60)
GFR, Est Non African American: 80 mL/min/{1.73_m2} (ref >=60)
Globulin: 2.6 g/dL (calc) (ref 1.9–3.7)
Glucose, Bld: 93 mg/dL (ref 65–99)
Potassium: 4.5 mmol/L (ref 3.5–5.3)
Sodium: 139 mmol/L (ref 135–146)
Total Bilirubin: 1.1 mg/dL (ref 0.2–1.2)
Total Protein: 7 g/dL (ref 6.1–8.1)

## 2019-03-05 LAB — LIPID PANEL
Cholesterol: 147 mg/dL (ref <200)
HDL: 51 mg/dL (ref >=40)
LDL Cholesterol (Calc): 82 mg/dL (calc)
Non-HDL Cholesterol (Calc): 96 mg/dL (calc) (ref <130)
Total CHOL/HDL Ratio: 2.9 (calc) (ref <5.0)
Triglycerides: 49 mg/dL (ref <150)

## 2019-03-05 NOTE — Progress Notes (Signed)
Steven Braun, I want to follow-up with the labs obtained yesterday. Great news!  The lipid panel is much improved with the total cholesterol 147 (down from 204 a year ago) and the LDL cholesterol (lousy type) was 82 (down from 118 a year ago).  The complete metabolic panel was all normal and included your blood sugar, electrolytes, liver and kidney function tests. Stay active, continue to eat a healthy diet and we will follow-up again in 1 year.  Sooner as needed. Stay safe, Dr. Dorris Fetch

## 2019-03-14 ENCOUNTER — Ambulatory Visit: Payer: Managed Care, Other (non HMO) | Attending: Internal Medicine

## 2019-03-14 DIAGNOSIS — Z23 Encounter for immunization: Secondary | ICD-10-CM

## 2019-03-14 NOTE — Progress Notes (Signed)
   Covid-19 Vaccination Clinic  Name:  Steven Braun    MRN: [id number] DOB: December 30, 1985  03/14/2019  Mr. Steven Braun was observed post Covid-19 immunization for 15 minutes without incident. He was provided with Vaccine Information Sheet and instruction to access the V-Safe system.   Mr. Steven Braun was instructed to call 911 with any severe reactions post vaccine: Marland Kitchen Difficulty breathing  . Swelling of face and throat  . A fast heartbeat  . A bad rash all over body  . Dizziness and weakness   Immunizations Administered    Name Date Dose VIS Date Route   Pfizer COVID-19 Vaccine 03/14/2019 10:17 AM 0.3 mL 12/12/2018 Intramuscular   Manufacturer: [employer], [employer]   Lot: [id number]   NDC: [address]-6

## 2019-04-07 ENCOUNTER — Ambulatory Visit: Payer: Managed Care, Other (non HMO) | Attending: Internal Medicine

## 2019-04-07 DIAGNOSIS — Z23 Encounter for immunization: Secondary | ICD-10-CM

## 2019-04-07 NOTE — Progress Notes (Signed)
   Covid-19 Vaccination Clinic  Name:  Steven Braun    MRN: [id number] DOB: Apr 07, 1985  04/07/2019  [name] was observed post Covid-19 immunization for 15 minutes without incident. He was provided with Vaccine Information Sheet and instruction to access the V-Safe system.   [name] was instructed to call 911 with any severe reactions post vaccine: Marland Kitchen Difficulty breathing  . Swelling of face and throat  . A fast heartbeat  . A bad rash all over body  . Dizziness and weakness   Immunizations Administered    Name Date Dose VIS Date Route   Pfizer COVID-19 Vaccine 04/07/2019 12:27 PM 0.3 mL 12/12/2018 Intramuscular   Manufacturer: [employer], [employer]   Lot: [id number]   NDC: [address]-3

## 2019-09-22 ENCOUNTER — Other Ambulatory Visit: Payer: Managed Care, Other (non HMO)

## 2019-09-22 ENCOUNTER — Other Ambulatory Visit: Payer: Self-pay | Admitting: Internal Medicine

## 2019-09-22 DIAGNOSIS — Z20822 Contact with and (suspected) exposure to covid-19: Secondary | ICD-10-CM

## 2019-09-24 LAB — SARS-COV-2, NAA 2 DAY TAT

## 2019-09-24 LAB — NOVEL CORONAVIRUS, NAA: SARS-CoV-2, NAA: NOT DETECTED

## 2020-03-04 ENCOUNTER — Encounter: Payer: Managed Care, Other (non HMO) | Admitting: Family Medicine

## 2020-05-19 ENCOUNTER — Encounter: Payer: Managed Care, Other (non HMO) | Admitting: Family Medicine

## 2020-05-19 DIAGNOSIS — E782 Mixed hyperlipidemia: Secondary | ICD-10-CM

## 2020-05-19 DIAGNOSIS — Z0000 Encounter for general adult medical examination without abnormal findings: Secondary | ICD-10-CM

## 2020-05-19 DIAGNOSIS — E6609 Other obesity due to excess calories: Secondary | ICD-10-CM

## 2020-06-20 ENCOUNTER — Encounter: Payer: Self-pay | Admitting: Family Medicine

## 2020-06-20 ENCOUNTER — Ambulatory Visit (INDEPENDENT_AMBULATORY_CARE_PROVIDER_SITE_OTHER): Payer: Managed Care, Other (non HMO) | Admitting: Family Medicine

## 2020-06-20 ENCOUNTER — Other Ambulatory Visit: Payer: Self-pay

## 2020-06-20 VITALS — BP 120/72 | HR 89 | Temp 97.6°F | Resp 18 | Ht 73.0 in | Wt 287.8 lb

## 2020-06-20 DIAGNOSIS — Z1329 Encounter for screening for other suspected endocrine disorder: Secondary | ICD-10-CM

## 2020-06-20 DIAGNOSIS — Z6837 Body mass index (BMI) 37.0-37.9, adult: Secondary | ICD-10-CM

## 2020-06-20 DIAGNOSIS — Z0000 Encounter for general adult medical examination without abnormal findings: Secondary | ICD-10-CM

## 2020-06-20 DIAGNOSIS — E6609 Other obesity due to excess calories: Secondary | ICD-10-CM | POA: Diagnosis not present

## 2020-06-20 DIAGNOSIS — Z13228 Encounter for screening for other metabolic disorders: Secondary | ICD-10-CM

## 2020-06-20 DIAGNOSIS — E782 Mixed hyperlipidemia: Secondary | ICD-10-CM | POA: Diagnosis not present

## 2020-06-20 DIAGNOSIS — Z130 Encounter for screening for diseases of the blood and blood-forming organs and certain disorders involving the immune mechanism: Secondary | ICD-10-CM

## 2020-06-20 LAB — COMPLETE METABOLIC PANEL WITH GFR
AG Ratio: 1.6 (calc) (ref 1.0–2.5)
ALT: 23 U/L (ref 9–46)
AST: 18 U/L (ref 10–40)
Albumin: 4.6 g/dL (ref 3.6–5.1)
Alkaline phosphatase (APISO): 78 U/L (ref 36–130)
BUN: 16 mg/dL (ref 7–25)
CO2: 27 mmol/L (ref 20–32)
Calcium: 9.6 mg/dL (ref 8.6–10.3)
Chloride: 104 mmol/L (ref 98–110)
Creat: 1.13 mg/dL (ref 0.60–1.35)
GFR, Est African American: 99 mL/min/{1.73_m2} (ref >=60)
GFR, Est Non African American: 86 mL/min/{1.73_m2} (ref >=60)
Globulin: 2.8 g/dL (calc) (ref 1.9–3.7)
Glucose, Bld: 89 mg/dL (ref 65–99)
Potassium: 4.5 mmol/L (ref 3.5–5.3)
Sodium: 137 mmol/L (ref 135–146)
Total Bilirubin: 0.9 mg/dL (ref 0.2–1.2)
Total Protein: 7.4 g/dL (ref 6.1–8.1)

## 2020-06-20 LAB — CBC WITH DIFFERENTIAL/PLATELET
Absolute Monocytes: 627 cells/uL (ref 200–950)
Basophils Absolute: 57 cells/uL (ref 0–200)
Basophils Relative: 0.6 %
Eosinophils Absolute: 209 cells/uL (ref 15–500)
Eosinophils Relative: 2.2 %
HCT: 44 % (ref 38.5–50.0)
Hemoglobin: 14.5 g/dL (ref 13.2–17.1)
Lymphs Abs: 2185 cells/uL (ref 850–3900)
MCH: 30.4 pg (ref 27.0–33.0)
MCHC: 33 g/dL (ref 32.0–36.0)
MCV: 92.2 fL (ref 80.0–100.0)
MPV: 10.3 fL (ref 7.5–12.5)
Monocytes Relative: 6.6 %
Neutro Abs: 6422 cells/uL (ref 1500–7800)
Neutrophils Relative %: 67.6 %
Platelets: 371 10*3/uL (ref 140–400)
RBC: 4.77 10*6/uL (ref 4.20–5.80)
RDW: 11.5 % (ref 11.0–15.0)
Total Lymphocyte: 23 %
WBC: 9.5 10*3/uL (ref 3.8–10.8)

## 2020-06-20 LAB — LIPID PANEL
Cholesterol: 168 mg/dL (ref <200)
HDL: 65 mg/dL (ref >=40)
LDL Cholesterol (Calc): 88 mg/dL (calc)
Non-HDL Cholesterol (Calc): 103 mg/dL (calc) (ref <130)
Total CHOL/HDL Ratio: 2.6 (calc) (ref <5.0)
Triglycerides: 68 mg/dL (ref <150)

## 2020-06-20 NOTE — Progress Notes (Signed)
Patient: Steven Braun, Male    DOB: 08-Nov-1985, 35 y.o.   MRN: [id number] Doren Custard, FNP Visit Date: 06/20/2020  Today's Provider: Danelle Berry, PA-C   Chief Complaint  Patient presents with   Annual Exam   Subjective:   Annual physical exam:  Steven Braun is a 35 y.o. male who presents today for health maintenance and annual & complete physical exam.   Exercise/Activity:   exercising 4d a week for 30 min each  Diet/nutrition:  working on healthier balanced diet for cholesterol  Sleep:  no concerns  USPSTF grade A and B recommendations - reviewed and addressed today  Depression:  Phq 9 completed today by patient, was reviewed by me with patient in the room, score is  negative, pt feels good PHQ 2/9 Scores 06/20/2020 03/04/2019 03/03/2018 12/31/2017  PHQ - 2 Score 0 0 1 1  PHQ- 9 Score - 0 1 3   Depression screen [facility] 2/9 06/20/2020 03/04/2019 03/03/2018 12/31/2017  Decreased Interest 0 0 0 0  Down, Depressed, Hopeless 0 0 1 1  PHQ - 2 Score 0 0 1 1  Altered sleeping - 0 0 0  Tired, decreased energy - 0 0 0  Change in appetite - 0 0 1  Feeling bad or failure about yourself  - 0 0 1  Trouble concentrating - 0 0 0  Moving slowly or fidgety/restless - 0 0 0  Suicidal thoughts - 0 - 0  PHQ-9 Score - 0 1 3  Difficult doing work/chores - Not difficult at all Somewhat difficult Not difficult at all    Hep C Screening: done STD testing and prevention (HIV/chl/gon/syphilis): done in the past, declined any STD screening Intimate partner violence:  denies, feels safe  Prostate cancer: not relevant per age and family hx Prostate cancer screening with PSA: Discussed risks and benefits of PSA testing and provided handout. Pt declines to have PSA drawn today.  No results found for: PSA   Advanced Care Planning:    A voluntary discussion about advance care planning including the explanation and discussion of advance directives.  Discussed health care proxy and Living will, and  the patient was able to identify a health care proxy as wife - Paris.  Patient does not have a living will at present time. If patient does have living will, I have requested they bring this to the clinic to be scanned in to their chart. Handout given  Health Maintenance  Topic Date Due   COVID-19 Vaccine (3 - Booster for Pfizer series) 09/07/2019   INFLUENZA VACCINE  08/01/2020   TETANUS/TDAP  01/01/2028   Hepatitis C Screening  Completed   HIV Screening  Completed   Pneumococcal Vaccine 30-83 Years old  Aged Out   HPV VACCINES  Aged Out     Skin cancer: last year   last skin survey was.  Pt reports no hx of skin cancer, suspicious lesions/biopsies in the past.  Colorectal cancer:  colonoscopy is not due per age and family hx   Pt denies change in bowels, hematochezia, melena  Lung cancer:   Low Dose CT Chest recommended if Age 7-80 years, 20 pack-year currently smoking OR have quit w/in 15years. Patient does not qualify.   Social History   Tobacco Use   Smoking status: Never   Smokeless tobacco: Never  Substance Use Topics   Alcohol use: Yes    Comment: 1-2 times a month     Alcohol screening: [employer] Office [facility]  AUDIT-C Score 1       AAA: n/a The USPSTF recommends one-time screening with ultrasonography in men ages 3165 to 3075 years who have ever smoked  ZOX:WRUEECG:none indicated today  Blood pressure/Hypertension: BP Readings from Last 3 Encounters:  06/20/20 120/72  03/04/19 100/60  03/03/18 120/80   Weight/Obesity: Wt Readings from Last 3 Encounters:  06/20/20 287 lb 12.8 oz (130.5 kg)  03/04/19 287 lb 8 oz (130.4 kg)  03/03/18 272 lb 12.8 oz (123.7 kg)   BMI Readings from Last 3 Encounters:  06/20/20 37.97 kg/m  03/04/19 37.93 kg/m  03/03/18 38.05 kg/m    Lipids:  Lab Results  Component Value Date   CHOL 147 03/04/2019   CHOL 204 (H) 12/31/2017   Lab Results  Component Value Date   HDL 51  03/04/2019   HDL 63 12/31/2017   Lab Results  Component Value Date   LDLCALC 82 03/04/2019   LDLCALC 118 (H) 12/31/2017   Lab Results  Component Value Date   TRIG 49 03/04/2019   TRIG 121 12/31/2017   Lab Results  Component Value Date   CHOLHDL 2.9 03/04/2019   CHOLHDL 3.2 12/31/2017   No results found for: LDLDIRECT Based on the results of lipid panel his/her cardiovascular risk factor ( using [facility] )  in the next 10 years is : The ASCVD Risk score Denman George(Goff DC Jr., et al., 2013) failed to calculate for the following reasons:   The 2013 ASCVD risk score is only valid for ages 3440 to 8279 Glucose:  Glucose, Bld  Date Value Ref Range Status  03/04/2019 93 65 - 99 mg/dL Final    Comment:    .            Fasting reference interval .   12/31/2017 83 65 - 99 mg/dL Final    Comment:    .            Fasting reference interval .     Social History      He  reports that he has never smoked. He has never used smokeless tobacco. He reports current alcohol use. He reports that he does not use drugs.       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    Social Determinants of Health   Financial Resource Strain: Low Risk    Difficulty of Paying Living Expenses: Not hard at all  Food Insecurity: No Food Insecurity   Worried About Programme researcher, broadcasting/film/videounning Out of Food in the Last Year: Never true   Ran Out of Food in the  Last Year: Never true  Transportation Needs: No Transportation Needs   Lack of Transportation (Medical): No   Lack of Transportation (Non-Medical): No  Physical Activity: Insufficiently Active  Days of Exercise per Week: 4 days   Minutes of Exercise per Session: 30 min  Stress: Stress Concern Present   Feeling of Stress : To some extent  Social Connections: Press photographer of Communication with Friends and Family: More than three times a week   Frequency of Social Gatherings with Friends and Family: Twice a week   Attends Religious Services: 1 to 4 times per year   Active Member of [employer] or Organizations: Yes   Attends Engineer, structural: More than 4 times per year   Marital Status: Married     Family History        Family Status  Relation Name Status   Mother  Deceased   Father  Alive   Sister  Alive   Brother  Alive   MGM  Alive   PGM  Deceased   PGF  Deceased        His family history includes Breast cancer (age of onset: 21) in his mother; Congestive Heart Failure in his paternal grandmother.       Family History  Problem Relation Age of Onset   Breast cancer Mother 50       Unsure if genetic testing was done   Congestive Heart Failure Paternal Grandmother     Patient Active Problem List   Diagnosis Date Noted   Hyperlipidemia 01/02/2018   Class 2 obesity due to excess calories without serious comorbidity with body mass index (BMI) of 37.0 to 37.9 in adult 01/02/2018    Past Surgical History:  Procedure Laterality Date   WISDOM TOOTH EXTRACTION  12/12/2017     Current Outpatient Medications:    acetaminophen (TYLENOL) 500 MG tablet, Take 500 mg by mouth every 6 (six) hours as needed. For pain relef, Disp: , Rfl:    Cetirizine HCl (ZYRTEC ALLERGY) 10 MG CAPS, Take by mouth daily., Disp: , Rfl:   No Known Allergies  Patient Care Team: Doren Custard, FNP as PCP - General (Family Medicine)   Chart Review: I personally  reviewed active problem list, medication list, allergies, family history, social history, health maintenance, notes from last encounter, lab results, imaging with the patient/caregiver today.   Review of Systems  Constitutional: Negative.  Negative for activity change, appetite change, fatigue and unexpected weight change.  HENT: Negative.    Eyes: Negative.   Respiratory: Negative.  Negative for shortness of breath.   Cardiovascular: Negative.  Negative for chest pain, palpitations and leg swelling.  Gastrointestinal: Negative.  Negative for abdominal pain and blood in stool.  Endocrine: Negative.   Genitourinary: Negative.  Negative for decreased urine volume, difficulty urinating, testicular pain and urgency.  Skin: Negative.  Negative for color change and pallor.  Allergic/Immunologic: Negative.   Neurological: Negative.  Negative for syncope, weakness, light-headedness and numbness.  Psychiatric/Behavioral: Negative.  Negative for confusion, dysphoric mood, self-injury and suicidal ideas. The patient is not nervous/anxious.   All other systems reviewed and are negative.        Objective:   Vitals:  Vitals:   06/20/20 1059  BP: 120/72  Pulse: 89  Resp: 18  Temp: 97.6 F (36.4 C)  TempSrc: Oral  SpO2: 97%  Weight: 287 lb 12.8 oz (130.5 kg)  Height: 6\' 1"  (1.854 m)    Body mass index is 37.97 kg/m.  Physical Exam Vitals and nursing note reviewed.  Constitutional:      General: He is not in acute distress.    Appearance: Normal  appearance. He is well-developed. He is obese. He is not ill-appearing, toxic-appearing or diaphoretic.     Interventions: Face mask in place.  HENT:     Head: Normocephalic and atraumatic.     Jaw: No trismus.     Right Ear: External ear normal.     Left Ear: External ear normal.  Eyes:     General: Lids are normal. No scleral icterus.       Right eye: No discharge.        Left eye: No discharge.     Conjunctiva/sclera: Conjunctivae  normal.  Neck:     Trachea: Trachea and phonation normal. No tracheal deviation.  Cardiovascular:     Rate and Rhythm: Normal rate and regular rhythm.     Pulses: Normal pulses.          Radial pulses are 2+ on the right side and 2+ on the left side.       Posterior tibial pulses are 2+ on the right side and 2+ on the left side.     Heart sounds: Normal heart sounds. No murmur heard.   No friction rub. No gallop.  Pulmonary:     Effort: Pulmonary effort is normal. No respiratory distress.     Breath sounds: Normal breath sounds. No stridor. No wheezing, rhonchi or rales.  Abdominal:     General: Bowel sounds are normal. There is no distension.  Musculoskeletal:     Right lower leg: No edema.     Left lower leg: No edema.  Skin:    General: Skin is warm and dry.     Coloration: Skin is not jaundiced.     Findings: No rash.     Nails: There is no clubbing.  Neurological:     Mental Status: He is alert. Mental status is at baseline.     Cranial Nerves: No dysarthria or facial asymmetry.     Motor: No tremor or abnormal muscle tone.     Gait: Gait normal.  Psychiatric:        Mood and Affect: Mood normal.        Speech: Speech normal.        Behavior: Behavior normal. Behavior is cooperative.     No results found for this or any previous visit (from the past 2160 hour(s)).  Diabetic Foot Exam: Diabetic Foot Exam - Simple   No data filed     Fall Risk: Fall Risk  06/20/2020 03/04/2019 03/03/2018 03/03/2018 12/31/2017  Falls in the past year? 0 0 0 0 0  Number falls in past yr: 0 0 0 0 0  Injury with Fall? 0 0 0 0 0  Follow up Falls evaluation completed - - - -    Functional Status Survey: Is the patient deaf or have difficulty hearing?: No Does the patient have difficulty seeing, even when wearing glasses/contacts?: No Does the patient have difficulty concentrating, remembering, or making decisions?: No Does the patient have difficulty walking or climbing stairs?: No Does  the patient have difficulty dressing or bathing?: No Does the patient have difficulty doing errands alone such as visiting a doctor's office or shopping?: No   Assessment & Plan:    CPE completed today  Prostate cancer screening and PSA options (with potential risks and benefits of testing vs not testing) were discussed along with recent recs/guidelines, shared decision making and handout/information given to pt today  USPSTF grade A and B recommendations reviewed with patient; age-appropriate recommendations, preventive care, screening  tests, etc discussed and encouraged; healthy living encouraged; see AVS for patient education given to patient  Discussed importance of 150 minutes of physical activity weekly, AHA exercise recommendations given to pt in AVS/handout  Discussed importance of healthy diet:  eating lean meats and proteins, avoiding trans fats and saturated fats, avoid simple sugars and excessive carbs in diet, eat 6 servings of fruit/vegetables daily and drink plenty of water and avoid sweet beverages.  DASH diet reviewed if pt has HTN  Recommended pt to do annual eye exam and routine dental exams/cleanings  Reviewed Health Maintenance: Health Maintenance  Topic Date Due   COVID-19 Vaccine (3 - Booster for Pfizer series) 09/07/2019   INFLUENZA VACCINE  08/01/2020   TETANUS/TDAP  01/01/2028   Hepatitis C Screening  Completed   HIV Screening  Completed   Pneumococcal Vaccine 64-28 Years old  Aged Out   HPV VACCINES  Aged Out    Immunizations: Immunization History  Administered Date(s) Administered   Influenza,inj,Quad PF,6+ Mos 12/31/2017   PFIZER(Purple Top)SARS-COV-2 Vaccination 03/14/2019, 04/07/2019   Tdap 12/31/2017     ICD-10-CM   1. Adult general medical exam  Z00.00 CBC with Differential/Platelet    COMPLETE METABOLIC PANEL WITH GFR    Lipid panel    2. Mixed hyperlipidemia  E78.2 COMPLETE METABOLIC PANEL WITH GFR    Lipid panel   lipids improved last  year with diet/lifestyle efforts, recheck labs    3. Class 2 obesity due to excess calories without serious comorbidity with body mass index (BMI) of 37.0 to 37.9 in adult  E66.09 COMPLETE METABOLIC PANEL WITH GFR   Z68.37 Lipid panel   Pt is exercising and states he needs to work on diet efforts, discussed options and educated pt    4. Screening for endocrine, metabolic and immunity disorder  Z13.29 CBC with Differential/Platelet   Z13.228 COMPLETE METABOLIC PANEL WITH GFR   Z13.0        Danelle Berry, PA-C 06/20/20 11:08 AM  [facility]

## 2020-06-20 NOTE — Patient Instructions (Signed)
Preventive Care 21-33 Years Old, Male Preventive care refers to lifestyle choices and visits with your health care provider that can promote health and wellness. This includes: A yearly physical exam. This is also called an annual wellness visit. Regular dental and eye exams. Immunizations. Screening for certain conditions. Healthy lifestyle choices, such as: Eating a healthy diet. Getting regular exercise. Not using drugs or products that contain nicotine and tobacco. Limiting alcohol use. What can I expect for my preventive care visit? Physical exam Your health care provider may check your: Height and weight. These may be used to calculate your BMI (body mass index). BMI is a measurement that tells if you are at a healthy weight. Heart rate and blood pressure. Body temperature. Skin for abnormal spots. Counseling Your health care provider may ask you questions about your: Past medical problems. Family's medical history. Alcohol, tobacco, and drug use. Emotional well-being. Home life and relationship well-being. Sexual activity. Diet, exercise, and sleep habits. Work and work environment. Access to firearms. What immunizations do I need?  Vaccines are usually given at various ages, according to a schedule. Your health care provider will recommend vaccines for you based on your age, medicalhistory, and lifestyle or other factors, such as travel or where you work. What tests do I need? Blood tests Lipid and cholesterol levels. These may be checked every 5 years starting at age 20. Hepatitis C test. Hepatitis B test. Screening  Diabetes screening. This is done by checking your blood sugar (glucose) after you have not eaten for a while (fasting). Genital exam to check for testicular cancer or hernias. STD (sexually transmitted disease) testing, if you are at risk. Talk with your health care provider about your test results, treatment options,and if necessary, the need for more  tests. Follow these instructions at home: Eating and drinking  Eat a healthy diet that includes fresh fruits and vegetables, whole grains, lean protein, and low-fat dairy products. Drink enough fluid to keep your urine pale yellow. Take vitamin and mineral supplements as recommended by your health care provider. Do not drink alcohol if your health care provider tells you not to drink. If you drink alcohol: Limit how much you have to 0-2 drinks a day. Be aware of how much alcohol is in your drink. In the [place] one drink equals one 12 oz bottle of beer (355 mL), one 5 oz glass of wine (148 mL), or one 1 oz glass of hard liquor (44 mL).  Lifestyle Take daily care of your teeth and gums. Brush your teeth every morning and night with fluoride toothpaste. Floss one time each day. Stay active. Exercise for at least 30 minutes 5 or more days each week. Do not use any products that contain nicotine or tobacco, such as cigarettes, e-cigarettes, and chewing tobacco. If you need help quitting, ask your health care provider. Do not use drugs. If you are sexually active, practice safe sex. Use a condom or other form of protection to prevent STIs (sexually transmitted infections). Find healthy ways to cope with stress, such as: Meditation, yoga, or listening to music. Journaling. Talking to a trusted person. Spending time with friends and family. Safety Always wear your seat belt while driving or riding in a vehicle. Do not drive: If you have been drinking alcohol. Do not ride with someone who has been drinking. When you are tired or distracted. While texting. Wear a helmet and other protective equipment during sports activities. If you have firearms in your house, make sure   you follow all gun safety procedures. Seek help if you have been physically or sexually abused. What's next? Go to your health care provider once a year for an annual wellness visit. Ask your health care provider how often  you should have your eyes and teeth checked. Stay up to date on all vaccines. This information is not intended to replace advice given to you by your health care provider. Make sure you discuss any questions you have with your healthcare provider. Document Revised: 09/03/2018 Document Reviewed: 12/12/2017 Elsevier Patient Education  2022 Elsevier Inc.  

## 2021-06-22 ENCOUNTER — Encounter: Payer: Managed Care, Other (non HMO) | Admitting: Family Medicine

## 2021-06-22 DIAGNOSIS — E6609 Other obesity due to excess calories: Secondary | ICD-10-CM

## 2021-06-22 DIAGNOSIS — Z0000 Encounter for general adult medical examination without abnormal findings: Secondary | ICD-10-CM

## 2021-06-22 DIAGNOSIS — E782 Mixed hyperlipidemia: Secondary | ICD-10-CM

## 2021-06-22 NOTE — Patient Instructions (Incomplete)
Preventive Care 21-34 Years Old, Male Preventive care refers to lifestyle choices and visits with your health care provider that can promote health and wellness. Preventive care visits are also called wellness exams. What can I expect for my preventive care visit? Counseling During your preventive care visit, your health care provider may ask about your: Medical history, including: Past medical problems. Family medical history. Current health, including: Emotional well-being. Home life and relationship well-being. Sexual activity. Lifestyle, including: Alcohol, nicotine or tobacco, and drug use. Access to firearms. Diet, exercise, and sleep habits. Safety issues such as seatbelt and bike helmet use. Sunscreen use. Work and work environment. Physical exam Your health care provider may check your: Height and weight. These may be used to calculate your BMI (body mass index). BMI is a measurement that tells if you are at a healthy weight. Waist circumference. This measures the distance around your waistline. This measurement also tells if you are at a healthy weight and may help predict your risk of certain diseases, such as type 2 diabetes and high blood pressure. Heart rate and blood pressure. Body temperature.  Vaccines are usually given at various ages, according to a schedule. Your health care provider will recommend vaccines for you based on your age, medical history, and lifestyle or other factors, such as travel or where you work. What tests do I need? Screening Your health care provider may recommend screening tests for certain conditions. This may include: Lipid and cholesterol levels. Diabetes screening. This is done by checking your blood sugar (glucose) after you have not eaten for a while (fasting). Hepatitis B test. Hepatitis C test. HIV (human immunodeficiency virus) test. STI (sexually transmitted infection) testing, if you are at risk. Talk with your health care  provider about your test results, treatment options, and if necessary, the need for more tests. Follow these instructions at home: Eating and drinking  Eat a healthy diet that includes fresh fruits and vegetables, whole grains, lean protein, and low-fat dairy products. Drink enough fluid to keep your urine pale yellow. Take vitamin and mineral supplements as recommended by your health care provider. Do not drink alcohol if your health care provider tells you not to drink. If you drink alcohol: Limit how much you have to 0-2 drinks a day. Know how much alcohol is in your drink. In the [place] one drink equals one 12 oz bottle of beer (355 mL), one 5 oz glass of wine (148 mL), or one 1 oz glass of hard liquor (44 mL). Lifestyle Brush your teeth every morning and night with fluoride toothpaste. Floss one time each day. Exercise for at least 30 minutes 5 or more days each week. Do not use any products that contain nicotine or tobacco. These products include cigarettes, chewing tobacco, and vaping devices, such as e-cigarettes. If you need help quitting, ask your health care provider. Do not use drugs. If you are sexually active, practice safe sex. Use a condom or other form of protection to prevent STIs. Find healthy ways to manage stress, such as: Meditation, yoga, or listening to music. Journaling. Talking to a trusted person. Spending time with friends and family. Minimize exposure to UV radiation to reduce your risk of skin cancer. Safety Always wear your seat belt while driving or riding in a vehicle. Do not drive: If you have been drinking alcohol. Do not ride with someone who has been drinking. If you have been using any mind-altering substances or drugs. While texting. When you are tired or   distracted. Wear a helmet and other protective equipment during sports activities. If you have firearms in your house, make sure you follow all gun safety procedures. Seek help if you have been  physically or sexually abused. What's next? Go to your health care provider once a year for an annual wellness visit. Ask your health care provider how often you should have your eyes and teeth checked. Stay up to date on all vaccines. This information is not intended to replace advice given to you by your health care provider. Make sure you discuss any questions you have with your health care provider. Document Revised: 06/15/2020 Document Reviewed: 06/15/2020 Elsevier Patient Education  2023 Elsevier Inc.  

## 2021-06-23 ENCOUNTER — Ambulatory Visit (INDEPENDENT_AMBULATORY_CARE_PROVIDER_SITE_OTHER): Payer: Managed Care, Other (non HMO) | Admitting: Family Medicine

## 2021-06-23 ENCOUNTER — Encounter: Payer: Self-pay | Admitting: Family Medicine

## 2021-06-23 VITALS — BP 118/72 | HR 98 | Temp 98.1°F | Resp 16 | Ht 73.0 in | Wt 279.9 lb

## 2021-06-23 DIAGNOSIS — Z6836 Body mass index (BMI) 36.0-36.9, adult: Secondary | ICD-10-CM | POA: Diagnosis not present

## 2021-06-23 DIAGNOSIS — E782 Mixed hyperlipidemia: Secondary | ICD-10-CM

## 2021-06-23 DIAGNOSIS — Z0000 Encounter for general adult medical examination without abnormal findings: Secondary | ICD-10-CM

## 2021-06-23 DIAGNOSIS — E6609 Other obesity due to excess calories: Secondary | ICD-10-CM

## 2021-06-23 LAB — CBC WITH DIFFERENTIAL/PLATELET
Basophils Absolute: 63 cells/uL (ref 0–200)
Eosinophils Absolute: 119 cells/uL (ref 15–500)
MPV: 10.4 fL (ref 7.5–12.5)

## 2021-06-24 LAB — CBC WITH DIFFERENTIAL/PLATELET
Absolute Monocytes: 420 cells/uL (ref 200–950)
Basophils Relative: 0.9 %
Eosinophils Relative: 1.7 %
HCT: 45.1 % (ref 38.5–50.0)
Hemoglobin: 15.1 g/dL (ref 13.2–17.1)
Lymphs Abs: 1778 cells/uL (ref 850–3900)
MCH: 30.8 pg (ref 27.0–33.0)
MCHC: 33.5 g/dL (ref 32.0–36.0)
MCV: 92 fL (ref 80.0–100.0)
Monocytes Relative: 6 %
Neutro Abs: 4620 cells/uL (ref 1500–7800)
Neutrophils Relative %: 66 %
Platelets: 351 10*3/uL (ref 140–400)
RBC: 4.9 10*6/uL (ref 4.20–5.80)
RDW: 11.5 % (ref 11.0–15.0)
Total Lymphocyte: 25.4 %
WBC: 7 10*3/uL (ref 3.8–10.8)

## 2021-06-24 LAB — COMPLETE METABOLIC PANEL WITH GFR
AG Ratio: 1.6 (calc) (ref 1.0–2.5)
ALT: 19 U/L (ref 9–46)
AST: 17 U/L (ref 10–40)
Albumin: 4.5 g/dL (ref 3.6–5.1)
Alkaline phosphatase (APISO): 69 U/L (ref 36–130)
BUN: 17 mg/dL (ref 7–25)
CO2: 23 mmol/L (ref 20–32)
Calcium: 9.7 mg/dL (ref 8.6–10.3)
Chloride: 105 mmol/L (ref 98–110)
Creat: 1.25 mg/dL (ref 0.60–1.26)
Globulin: 2.8 g/dL (calc) (ref 1.9–3.7)
Glucose, Bld: 87 mg/dL (ref 65–99)
Potassium: 4.8 mmol/L (ref 3.5–5.3)
Sodium: 137 mmol/L (ref 135–146)
Total Bilirubin: 0.9 mg/dL (ref 0.2–1.2)
Total Protein: 7.3 g/dL (ref 6.1–8.1)
eGFR: 78 mL/min/{1.73_m2} (ref >=60)

## 2021-06-24 LAB — HEMOGLOBIN A1C
Hgb A1c MFr Bld: 4.8 % of total Hgb (ref <5.7)
Mean Plasma Glucose: 91 mg/dL
eAG (mmol/L): 5 mmol/L

## 2021-06-24 LAB — LIPID PANEL
Cholesterol: 159 mg/dL (ref <200)
HDL: 57 mg/dL (ref >=40)
LDL Cholesterol (Calc): 84 mg/dL (calc)
Non-HDL Cholesterol (Calc): 102 mg/dL (calc) (ref <130)
Total CHOL/HDL Ratio: 2.8 (calc) (ref <5.0)
Triglycerides: 86 mg/dL (ref <150)

## 2022-06-25 ENCOUNTER — Encounter: Payer: Self-pay | Admitting: Physician Assistant

## 2022-06-25 ENCOUNTER — Ambulatory Visit (INDEPENDENT_AMBULATORY_CARE_PROVIDER_SITE_OTHER): Payer: 59 | Admitting: Physician Assistant

## 2022-06-25 VITALS — BP 124/86 | HR 110 | Temp 97.4°F | Resp 18 | Ht 73.0 in | Wt 301.3 lb

## 2022-06-25 DIAGNOSIS — Z0000 Encounter for general adult medical examination without abnormal findings: Secondary | ICD-10-CM | POA: Diagnosis not present

## 2022-06-25 DIAGNOSIS — E6609 Other obesity due to excess calories: Secondary | ICD-10-CM | POA: Diagnosis not present

## 2022-06-25 DIAGNOSIS — Z6839 Body mass index (BMI) 39.0-39.9, adult: Secondary | ICD-10-CM | POA: Diagnosis not present

## 2022-06-25 NOTE — Assessment & Plan Note (Signed)
Chronic, ongoing concern We discussed importance of monitoring calorie intake, engaging in balanced nutrition and exercising regularly to assist with weight management Stressed importance of watching liquid calorie intake such as sweet tea and other beverages Pt education materials provided  Recommend follow up in about 6 months if patient is still having difficulty managing weight without medication intervention

## 2022-06-25 NOTE — Progress Notes (Signed)
Annual Physical Exam   Name: Steven Braun   MRN: [id number]    DOB: 1985/12/28   Date:06/25/2022  Today's Provider: Jacquelin Hawking, MHS, PA-C Introduced myself to the patient as a PA-C and provided education on APPs in clinical practice.         Subjective  Chief Complaint  Chief Complaint  Patient presents with   Annual Exam    HPI  Patient presents for annual CPE .   Diet: He reports he is eating plenty of protein, vegetables and carbs. He reports he drinks a lot of sweet tea.  Exercise: not engaged in regular exercise  He reports he has been eating late at night and has put on about 20 lbs in the last 3-4 months  Sleep: "it's fine, I just need to sleep more" getting about 6 hours per night. If he goes to bed at preferred time, he feels well rested in the AM  He reports he has been told that he snores at night  Mood:"pretty good"      Depression: phq 9 is negative    06/25/2022    8:27 AM 06/23/2021    9:27 AM 06/20/2020   11:02 AM 03/04/2019    8:02 AM 03/03/2018   10:57 AM  Depression screen PHQ 2/9  Decreased Interest 0 1 0 0 0  Down, Depressed, Hopeless 0 1 0 0 1  PHQ - 2 Score 0 2 0 0 1  Altered sleeping 0 0  0 0  Tired, decreased energy 0 0  0 0  Change in appetite 0 0  0 0  Feeling bad or failure about yourself  0 0  0 0  Trouble concentrating 0 0  0 0  Moving slowly or fidgety/restless 0 0  0 0  Suicidal thoughts 0 0  0   PHQ-9 Score 0 2  0 1  Difficult doing work/chores Not difficult at all Somewhat difficult  Not difficult at all Somewhat difficult    Hypertension:  BP Readings from Last 3 Encounters:  06/25/22 124/86  06/23/21 118/72  06/20/20 120/72    Obesity: Wt Readings from Last 3 Encounters:  06/25/22 (!) 301 lb 4.8 oz (136.7 kg)  06/23/21 279 lb 14.4 oz (127 kg)  06/20/20 287 lb 12.8 oz (130.5 kg)   BMI Readings from Last 3 Encounters:  06/25/22 39.75 kg/m  06/23/21 36.93 kg/m  06/20/20 37.97 kg/m     Lipids:  Lab Results   Component Value Date   CHOL 159 06/23/2021   CHOL 168 06/20/2020   CHOL 147 03/04/2019   Lab Results  Component Value Date   HDL 57 06/23/2021   HDL 65 06/20/2020   HDL 51 03/04/2019   Lab Results  Component Value Date   LDLCALC 84 06/23/2021   LDLCALC 88 06/20/2020   LDLCALC 82 03/04/2019   Lab Results  Component Value Date   TRIG 86 06/23/2021   TRIG 68 06/20/2020   TRIG 49 03/04/2019   Lab Results  Component Value Date   CHOLHDL 2.8 06/23/2021   CHOLHDL 2.6 06/20/2020   CHOLHDL 2.9 03/04/2019   No results found for: "LDLDIRECT" Glucose:  Glucose, Bld  Date Value Ref Range Status  06/23/2021 87 65 - 99 mg/dL Final    Comment:    .            Fasting reference interval .   06/20/2020 89 65 - 99 mg/dL Final    Comment:    .  Fasting reference interval .   03/04/2019 93 65 - 99 mg/dL Final    Comment:    .            Fasting reference interval .     Flowsheet Row Office Visit from 06/23/2021 in [facility]  AUDIT-C Score 0        Married STD testing and prevention (HIV/chl/gon/syphilis):  no- declines today  HIV screening: completed  Hep C Screening: completed  Skin cancer: Discussed monitoring for atypical lesions Colorectal cancer screening: Denies family hx to his knowledge, recommend routine screening at age 104  Prostate cancer screening:  not applicable No results found for: "PSA"   Lung cancer:  Low Dose CT Chest recommended if Age 92-80 years, 30 pack-year currently smoking OR have quit w/in 15years. Patient  not applicable AAA: The USPSTF recommends one-time screening with ultrasonography in men ages 61 to 75 years who have ever smoked. Patient:  not applicable ECG:  NA  Vaccines:   HPV: aged out  Tdap: UTD Shingrix: NA Pneumonia: NA Flu: due in the fall  COVID-19: Discussed vaccine and booster recommendations per CDC protocol   Advanced Care Planning: A voluntary discussion about advance  care planning including the explanation and discussion of advance directives.  Discussed health care proxy and Living will, and the patient was able to identify a health care proxy as no one.  Patient does not know have a living will in effect at this time.  Patient Active Problem List   Diagnosis Date Noted   Hyperlipidemia 01/02/2018   Class 2 obesity due to excess calories without serious comorbidity with body mass index (BMI) of 39.0 to 39.9 in adult 01/02/2018    Past Surgical History:  Procedure Laterality Date   WISDOM TOOTH EXTRACTION  12/12/2017    Family History  Problem Relation Age of Onset   Breast cancer Mother 67       Unsure if genetic testing was done   Congestive Heart Failure Paternal Grandmother     Social History   Socioeconomic History   Marital status: Married    Spouse name: Paris   Number of children: Not on file   Years of education: Bachelor's   Highest education level: Bachelor's degree (e.g., BA, AB, BS)  Occupational History   Not on file  Tobacco Use   Smoking status: Never   Smokeless tobacco: Never  Vaping Use   Vaping Use: Never used  Substance and Sexual Activity   Alcohol use: Yes    Comment: 1-2 times a month   Drug use: No   Sexual activity: Yes    Partners: Female  Other Topics Concern   Not on file  Social History Narrative   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    Social Determinants of Health   Financial Resource Strain: Low Risk  (06/23/2021)   Overall Financial Resource Strain (CARDIA)    Difficulty of Paying Living Expenses: Not hard at all  Food Insecurity: No Food Insecurity (06/23/2021)   Hunger Vital Sign    Worried About Running Out of Food in the Last Year: Never true    Ran Out of Food in the Last Year: Never true  Transportation Needs: No Transportation Needs (06/23/2021)   PRAPARE - Administrator, Civil Service (Medical): No    Lack of Transportation (Non-Medical): No  Physical  Activity: Insufficiently Active (06/23/2021)   Exercise Vital Sign    Days of Exercise  per Week: 3 days    Minutes of Exercise per Session: 40 min  Stress: No Stress Concern Present (06/23/2021)   [employer] of Occupational Health - Occupational Stress Questionnaire    Feeling of Stress : Only a little  Social Connections: Socially Integrated (06/23/2021)   Social Connection and Isolation Panel [NHANES]    Frequency of Communication with Friends and Family: More than three times a week    Frequency of Social Gatherings with Friends and Family: More than three times a week    Attends Religious Services: 1 to 4 times per year    Active Member of [employer] or Organizations: Yes    Attends Banker Meetings: 1 to 4 times per year    Marital Status: Married  Catering manager Violence: Not At Risk (06/23/2021)   Humiliation, Afraid, Rape, and Kick questionnaire    Fear of Current or Ex-Partner: No    Emotionally Abused: No    Physically Abused: No    Sexually Abused: No     Current Outpatient Medications:    acetaminophen (TYLENOL) 500 MG tablet, Take 500 mg by mouth every 6 (six) hours as needed. For pain relef, Disp: , Rfl:    Cetirizine HCl (ZYRTEC ALLERGY) 10 MG CAPS, Take by mouth daily., Disp: , Rfl:   No Known Allergies   Review of Systems  Constitutional:  Negative for chills, fever, malaise/fatigue and weight loss.  HENT:  Negative for hearing loss, nosebleeds, sore throat and tinnitus.   Eyes:  Negative for blurred vision, double vision and photophobia.  Respiratory:  Negative for cough, shortness of breath and wheezing.   Cardiovascular:  Negative for chest pain, palpitations and leg swelling.  Gastrointestinal:  Negative for blood in stool, constipation, diarrhea, heartburn, nausea and vomiting.  Genitourinary:  Negative for dysuria, frequency and hematuria.  Musculoskeletal:  Negative for falls, joint pain and myalgias.  Skin:  Negative for itching and rash.   Neurological:  Negative for dizziness, tingling, tremors, loss of consciousness, weakness and headaches.  Psychiatric/Behavioral:  Negative for depression, memory loss, substance abuse and suicidal ideas. The patient is not nervous/anxious and does not have insomnia.        Objective  Vitals:   06/25/22 0825  BP: 124/86  Pulse: (!) 110  Resp: 18  Temp: (!) 97.4 F (36.3 C)  SpO2: 97%  Weight: (!) 301 lb 4.8 oz (136.7 kg)  Height: 6\' 1"  (1.854 m)    Body mass index is 39.75 kg/m.  Physical Exam Vitals reviewed.  Constitutional:      General: He is awake. He is not in acute distress.    Appearance: Normal appearance. He is well-developed and well-groomed. He is not ill-appearing or toxic-appearing.  HENT:     Head: Normocephalic and atraumatic.     Right Ear: Hearing, tympanic membrane, ear canal and external ear normal.     Left Ear: Hearing, tympanic membrane, ear canal and external ear normal.     Nose: Nose normal.     Mouth/Throat:     Lips: Pink.     Mouth: Mucous membranes are moist.     Dentition: Normal dentition. No dental caries.     Pharynx: Oropharynx is clear. Uvula midline. No oropharyngeal exudate, posterior oropharyngeal erythema or uvula swelling.     Tonsils: No tonsillar exudate.  Eyes:     General: Lids are normal. Gaze aligned appropriately.     Extraocular Movements: Extraocular movements intact.     Right eye: Normal  extraocular motion and no nystagmus.     Left eye: Normal extraocular motion and no nystagmus.     Conjunctiva/sclera: Conjunctivae normal.     Pupils: Pupils are equal, round, and reactive to light.  Neck:     Thyroid: No thyroid mass, thyromegaly or thyroid tenderness.     Trachea: Phonation normal.  Cardiovascular:     Rate and Rhythm: Normal rate and regular rhythm.     Pulses: Normal pulses.          Radial pulses are 2+ on the right side and 2+ on the left side.     Heart sounds: Normal heart sounds. No murmur heard.     No friction rub. No gallop.  Pulmonary:     Effort: Pulmonary effort is normal.     Breath sounds: Normal breath sounds. No decreased air movement. No decreased breath sounds, wheezing, rhonchi or rales.  Abdominal:     General: Abdomen is flat. Bowel sounds are normal.     Palpations: Abdomen is soft.     Tenderness: There is no abdominal tenderness.  Genitourinary:    Comments: Patient denies concerns for genital area today. Exam deferred after shared-decision making.   Musculoskeletal:     Cervical back: Normal range of motion and neck supple.     Right lower leg: No edema.     Left lower leg: No edema.  Lymphadenopathy:     Head:     Right side of head: No submental, submandibular or preauricular adenopathy.     Left side of head: No submental, submandibular or preauricular adenopathy.     Cervical:     Right cervical: No superficial or posterior cervical adenopathy.    Left cervical: No superficial or posterior cervical adenopathy.     Upper Body:     Right upper body: No supraclavicular adenopathy.     Left upper body: No supraclavicular adenopathy.  Skin:    General: Skin is warm and dry.     Capillary Refill: Capillary refill takes less than 2 seconds.  Neurological:     General: No focal deficit present.     Mental Status: He is alert and oriented to person, place, and time. Mental status is at baseline.     GCS: GCS eye subscore is 4. GCS verbal subscore is 5. GCS motor subscore is 6.     Cranial Nerves: Cranial nerves 2-12 are intact. No dysarthria or facial asymmetry.     Motor: Motor function is intact.     Coordination: Coordination is intact.     Gait: Gait is intact.     Deep Tendon Reflexes:     Reflex Scores:      Patellar reflexes are 1+ on the right side and 1+ on the left side. Psychiatric:        Attention and Perception: Attention and perception normal.        Mood and Affect: Mood and affect normal.        Speech: Speech normal.        Behavior:  Behavior normal. Behavior is cooperative.        Thought Content: Thought content normal.        Cognition and Memory: Cognition and memory normal.        Judgment: Judgment normal.      No results found for this or any previous visit (from the past 2160 hour(s)).   Fall Risk:    06/25/2022    8:25 AM 06/23/2021  9:26 AM 06/20/2020   11:01 AM 03/04/2019    8:02 AM 03/03/2018   10:49 AM  Fall Risk   Falls in the past year? 0 0 0 0 0  Number falls in past yr: 0 0 0 0 0  Injury with Fall? 0 0 0 0 0  Risk for fall due to :  No Fall Risks     Follow up  Falls prevention discussed;Education provided Falls evaluation completed       Functional Status Survey: Is the patient deaf or have difficulty hearing?: No Does the patient have difficulty seeing, even when wearing glasses/contacts?: No Does the patient have difficulty concentrating, remembering, or making decisions?: No Does the patient have difficulty walking or climbing stairs?: No Does the patient have difficulty dressing or bathing?: No Does the patient have difficulty doing errands alone such as visiting a doctor's office or shopping?: No    Assessment & Plan  Problem List Items Addressed This Visit       Other   Class 2 obesity due to excess calories without serious comorbidity with body mass index (BMI) of 39.0 to 39.9 in adult    Chronic, ongoing concern We discussed importance of monitoring calorie intake, engaging in balanced nutrition and exercising regularly to assist with weight management Stressed importance of watching liquid calorie intake such as sweet tea and other beverages Pt education materials provided  Recommend follow up in about 6 months if patient is still having difficulty managing weight without medication intervention      Other Visit Diagnoses     Annual physical exam    -  Primary   -Prostate cancer screening and PSA options (with potential risks and benefits of testing vs not testing)  were discussed along with recent recs/guidelines. -USPSTF grade A and B recommendations reviewed with patient; age-appropriate recommendations, preventive care, screening tests, etc discussed and encouraged; healthy living encouraged; see AVS for patient education given to patient -Discussed importance of 150 minutes of physical activity weekly, eat two servings of fish weekly, eat one serving of tree nuts ( cashews, pistachios, pecans, almonds.Marland Kitchen) every other day, eat 6 servings of fruit/vegetables daily and drink plenty of water and avoid sweet beverages.  -Reviewed Health Maintenance: yes    Relevant Orders   COMPLETE METABOLIC PANEL WITH GFR   CBC w/Diff/Platelet   Lipid Profile   TSH   HgB A1c        Return in about 6 months (around 12/25/2022) for weight discussion.   I, Terris Bodin E Olayinka Gathers, PA-C, have reviewed all documentation for this visit. The documentation on 06/25/22 for the exam, diagnosis, procedures, and orders are all accurate and complete.   Jacquelin Hawking, MHS, PA-C [facility]

## 2022-06-26 LAB — CBC WITH DIFFERENTIAL/PLATELET
Absolute Monocytes: 551 cells/uL (ref 200–950)
Eosinophils Relative: 1.2 %
HCT: 41.1 % (ref 38.5–50.0)
Hemoglobin: 13.8 g/dL (ref 13.2–17.1)
MCH: 30.5 pg (ref 27.0–33.0)
MCHC: 33.6 g/dL (ref 32.0–36.0)
MPV: 10.3 fL (ref 7.5–12.5)
Neutrophils Relative %: 72.1 %
Total Lymphocyte: 19.4 %
WBC: 8.1 10*3/uL (ref 3.8–10.8)

## 2022-06-27 LAB — CBC WITH DIFFERENTIAL/PLATELET
Basophils Absolute: 41 cells/uL (ref 0–200)
Basophils Relative: 0.5 %
Eosinophils Absolute: 97 cells/uL (ref 15–500)
Lymphs Abs: 1571 cells/uL (ref 850–3900)
MCV: 90.7 fL (ref 80.0–100.0)
Monocytes Relative: 6.8 %
Neutro Abs: 5840 cells/uL (ref 1500–7800)
Platelets: 346 10*3/uL (ref 140–400)
RBC: 4.53 10*6/uL (ref 4.20–5.80)
RDW: 11.7 % (ref 11.0–15.0)

## 2022-06-27 LAB — HEMOGLOBIN A1C
Hgb A1c MFr Bld: 5.2 % of total Hgb (ref <5.7)
Mean Plasma Glucose: 103 mg/dL
eAG (mmol/L): 5.7 mmol/L

## 2022-06-27 LAB — COMPLETE METABOLIC PANEL WITH GFR
AG Ratio: 1.7 (calc) (ref 1.0–2.5)
ALT: 22 U/L (ref 9–46)
AST: 18 U/L (ref 10–40)
Albumin: 4.3 g/dL (ref 3.6–5.1)
Alkaline phosphatase (APISO): 68 U/L (ref 36–130)
BUN/Creatinine Ratio: 15 (calc) (ref 6–22)
BUN: 19 mg/dL (ref 7–25)
CO2: 26 mmol/L (ref 20–32)
Calcium: 9.7 mg/dL (ref 8.6–10.3)
Chloride: 106 mmol/L (ref 98–110)
Creat: 1.3 mg/dL — ABNORMAL HIGH (ref 0.60–1.26)
Globulin: 2.5 g/dL (calc) (ref 1.9–3.7)
Glucose, Bld: 96 mg/dL (ref 65–99)
Potassium: 4.6 mmol/L (ref 3.5–5.3)
Sodium: 139 mmol/L (ref 135–146)
Total Bilirubin: 0.4 mg/dL (ref 0.2–1.2)
Total Protein: 6.8 g/dL (ref 6.1–8.1)
eGFR: 74 mL/min/{1.73_m2} (ref >=60)

## 2022-06-27 LAB — LIPID PANEL
Cholesterol: 164 mg/dL (ref <200)
HDL: 55 mg/dL (ref >=40)
LDL Cholesterol (Calc): 93 mg/dL (calc)
Non-HDL Cholesterol (Calc): 109 mg/dL (calc) (ref <130)
Total CHOL/HDL Ratio: 3 (calc) (ref <5.0)
Triglycerides: 70 mg/dL (ref <150)

## 2022-06-27 LAB — TSH: TSH: 0.99 mIU/L (ref 0.40–4.50)

## 2022-06-27 NOTE — Progress Notes (Signed)
Your lab results have returned Your electrolytes, liver and kidney function are in normal limits Your blood count was normal- no signs of anemia Your cholesterol looks great Your thyroid function looks good Your A1c  was 5.2 which is in normal range. (Alc is a number that reflects the 3 month average of your blood glucose and is used to determine if someone has prediabetes or diabetes) Everything looks good and normal right now. Let us know if you have questions or concerns.

## 2022-10-30 ENCOUNTER — Other Ambulatory Visit: Payer: Self-pay

## 2022-10-30 ENCOUNTER — Ambulatory Visit
Admission: RE | Admit: 2022-10-30 | Discharge: 2022-10-30 | Disposition: A | Discharge status: Home / Self Care | Payer: 59 | Source: Ambulatory Visit | Attending: Nurse Practitioner | Admitting: Nurse Practitioner

## 2022-10-30 VITALS — BP 122/84 | HR 111 | Temp 99.7°F | Resp 20

## 2022-10-30 DIAGNOSIS — J020 Streptococcal pharyngitis: Secondary | ICD-10-CM

## 2022-10-30 LAB — POC COVID19/FLU A&B COMBO
Covid Antigen, POC: NEGATIVE
Influenza A Antigen, POC: NEGATIVE
Influenza B Antigen, POC: NEGATIVE

## 2022-10-30 LAB — POCT RAPID STREP A (OFFICE): Rapid Strep A Screen: POSITIVE — AB

## 2022-10-30 MED ORDER — AMOXICILLIN 500 MG PO CAPS: 500.0000 mg | ORAL_CAPSULE | Freq: Two times a day (BID) | ORAL | 0 refills | Status: AC

## 2022-10-30 NOTE — ED Provider Notes (Signed)
[facility]    CSN: [id number] Arrival date & time: 10/30/22  1138      History   Chief Complaint Chief Complaint  Patient presents with   Influenza    Entered by patient    HPI Steven Braun is a 37 y.o. male.   Patient presents today with 2-day history of chills, sore throat, and headache.  He denies known fevers, cough, congestion, runny or stuffy nose, ear pain, abdominal pain, nausea/vomiting, and diarrhea.  No change in appetite or change in energy levels.  No known sick contacts.  Recently returned from a trip to [place] for his brother's wedding.  Patient denies antibiotic use in the past 90 days.  Reports history of strep throat years ago.    History reviewed. No pertinent past medical history.  Patient Active Problem List   Diagnosis Date Noted   Hyperlipidemia 01/02/2018   Class 2 obesity due to excess calories without serious comorbidity with body mass index (BMI) of 39.0 to 39.9 in adult 01/02/2018    Past Surgical History:  Procedure Laterality Date   WISDOM TOOTH EXTRACTION  12/12/2017       Home Medications    Prior to Admission medications   Medication Sig Start Date End Date Taking? Authorizing Provider  amoxicillin (AMOXIL) 500 MG capsule Take 1 capsule (500 mg total) by mouth 2 (two) times daily for 10 days. 10/30/22 11/09/22 Yes Valentino Nose, NP  acetaminophen (TYLENOL) 500 MG tablet Take 500 mg by mouth every 6 (six) hours as needed. For pain relef    [provider]  Cetirizine HCl (ZYRTEC ALLERGY) 10 MG CAPS Take by mouth daily.    [provider]    Family History Family History  Problem Relation Age of Onset   Breast cancer Mother 73       Unsure if genetic testing was done   Congestive Heart Failure Paternal Grandmother     Social History Social History   Tobacco Use   Smoking status: Never   Smokeless tobacco: Never  Vaping Use   Vaping status: Never Used  Substance Use Topics   Alcohol  use: Yes    Comment: 1-2 times a month   Drug use: No     Allergies   Patient has no known allergies.   Review of Systems Review of Systems Per HPI  Physical Exam Triage Vital Signs ED Triage Vitals  Encounter Vitals Group     BP 10/30/22 1153 122/84     Systolic BP Percentile --      Diastolic BP Percentile --      Pulse Rate 10/30/22 1153 (!) 111     Resp 10/30/22 1153 20     Temp 10/30/22 1153 99.7 F (37.6 C)     Temp Source 10/30/22 1153 Oral     SpO2 10/30/22 1153 95 %     Weight --      Height --      Head Circumference --      Peak Flow --      Pain Score 10/30/22 1152 5     Pain Loc --      Pain Education --      Exclude from Growth Chart --    No data found.  Updated Vital Signs BP 122/84 (BP Location: Right Arm)   Pulse (!) 111   Temp 99.7 F (37.6 C) (Oral)   Resp 20   SpO2 95%   Visual Acuity Right Eye Distance:  Left Eye Distance:   Bilateral Distance:    Right Eye Near:   Left Eye Near:    Bilateral Near:     Physical Exam Vitals and nursing note reviewed.  Constitutional:      General: He is not in acute distress.    Appearance: He is well-developed. He is not ill-appearing, toxic-appearing or diaphoretic.  HENT:     Head: Normocephalic and atraumatic.     Right Ear: Tympanic membrane and ear canal normal. No drainage, swelling or tenderness. No middle ear effusion. Tympanic membrane is not erythematous.     Left Ear: Tympanic membrane and ear canal normal. No drainage, swelling or tenderness.  No middle ear effusion. Tympanic membrane is not erythematous.     Nose: No congestion or rhinorrhea.     Mouth/Throat:     Mouth: Mucous membranes are dry.     Pharynx: No posterior oropharyngeal erythema or uvula swelling.     Tonsils: Tonsillar exudate present. No tonsillar abscesses. 2+ on the right. 2+ on the left.  Eyes:     Conjunctiva/sclera: Conjunctivae normal.  Cardiovascular:     Rate and Rhythm: Tachycardia present.   Pulmonary:     Effort: Pulmonary effort is normal. No respiratory distress.     Breath sounds: Normal breath sounds. No wheezing, rhonchi or rales.  Musculoskeletal:     Cervical back: Neck supple.  Lymphadenopathy:     Cervical: Cervical adenopathy present.  Skin:    General: Skin is warm and dry.     Coloration: Skin is not pale.     Findings: No erythema or rash.  Neurological:     Mental Status: He is alert and oriented to person, place, and time.      UC Treatments / Results  Labs (all labs ordered are listed, but only abnormal results are displayed) Labs Reviewed  POCT RAPID STREP A (OFFICE) - Abnormal; Notable for the following components:      Result Value   Rapid Strep A Screen Positive (*)    All other components within normal limits  POC COVID19/FLU A&B COMBO    EKG   Radiology No results found.  Procedures Procedures (including critical care time)  Medications Ordered in UC Medications - No data to display  Initial Impression / Assessment and Plan / UC Course  I have reviewed the triage vital signs and the nursing notes.  Pertinent labs & imaging results that were available during my care of the patient were reviewed by me and considered in my medical decision making (see chart for details).   Patient is well-appearing, normotensive, afebrile, is mildly tachycardic, not tachypneic, oxygenating well on room air.    1. Streptococcal sore throat Treat with amoxicillin twice daily for 10 days Change toothbrush after starting treatment Supportive care discussed Work excuse provided   The patient was given the opportunity to ask questions.  All questions answered to their satisfaction.  The patient is in agreement to this plan.    Final Clinical Impressions(s) / UC Diagnoses   Final diagnoses:  Streptococcal sore throat     Discharge Instructions      You tested positive for strep throat today.  The influenza and COVID-19 tests were negative.   To treat strep throat, please take the amoxicillin twice daily for 10 days as prescribed.  Change your toothbrush today or tomorrow to prevent reinfection.  You are considered contagious until you have been on the antibiotic medicine for 24 hours.  ED Prescriptions     Medication Sig Dispense Auth. Provider   amoxicillin (AMOXIL) 500 MG capsule Take 1 capsule (500 mg total) by mouth 2 (two) times daily for 10 days. 20 capsule Valentino Nose, NP      PDMP not reviewed this encounter.   Valentino Nose, NP 10/30/22 1228

## 2022-10-30 NOTE — Discharge Instructions (Signed)
You tested positive for strep throat today.  The influenza and COVID-19 tests were negative.  To treat strep throat, please take the amoxicillin twice daily for 10 days as prescribed.  Change your toothbrush today or tomorrow to prevent reinfection.  You are considered contagious until you have been on the antibiotic medicine for 24 hours.

## 2022-10-30 NOTE — ED Triage Notes (Signed)
Pt reports chills since Sunday, intermittent sore throat. Has been trying tylenol and dayquil. Pt reports home covid test was negative and pt recently traveled and returned from [place].

## 2022-12-24 ENCOUNTER — Ambulatory Visit: Payer: Managed Care, Other (non HMO) | Admitting: Family Medicine

## 2023-05-08 ENCOUNTER — Ambulatory Visit (HOSPITAL_COMMUNITY)

## 2023-05-09 ENCOUNTER — Encounter (HOSPITAL_COMMUNITY): Payer: Self-pay

## 2023-05-09 ENCOUNTER — Ambulatory Visit (HOSPITAL_COMMUNITY)
Admission: RE | Admit: 2023-05-09 | Discharge: 2023-05-09 | Disposition: A | Discharge status: Home / Self Care | Payer: Self-pay | Source: Ambulatory Visit | Attending: Emergency Medicine | Admitting: Emergency Medicine

## 2023-05-09 VITALS — BP 123/86 | HR 81 | Temp 98.0°F | Resp 16

## 2023-05-09 DIAGNOSIS — H6993 Unspecified Eustachian tube disorder, bilateral: Secondary | ICD-10-CM | POA: Diagnosis not present

## 2023-05-09 DIAGNOSIS — H60501 Unspecified acute noninfective otitis externa, right ear: Secondary | ICD-10-CM | POA: Diagnosis not present

## 2023-05-09 MED ORDER — OFLOXACIN 0.3 % OT SOLN: 3.0000 [drp] | Freq: Two times a day (BID) | OTIC | 0 refills | Status: AC

## 2023-05-09 NOTE — ED Triage Notes (Signed)
 Pt states bilateral ear fullness for over a week.

## 2023-05-09 NOTE — Discharge Instructions (Signed)
 Flonase nasal spray once daily to help with ear pressure/fullness  Use the ofloxacin ear drops in the right ear twice daily for 5 days in a row. You likely have an ear canal infection from a foreign body (tissue)

## 2023-05-09 NOTE — ED Provider Notes (Signed)
 [facility]    CSN: [id number] Arrival date & time: 05/09/23  1458      History   Chief Complaint Chief Complaint  Patient presents with   Ear Fullness    HPI Steven Braun is a 38 y.o. male.  Here with 1 week history of bilateral ear fullness, muffled hearing Not having any pain. No drainage Has been using q-tips in the ears to try and relieve fullness  Does work outside, history of seasonal allergies. Takes zyrtec and xyzal.  History reviewed. No pertinent past medical history.  Patient Active Problem List   Diagnosis Date Noted   Hyperlipidemia 01/02/2018   Class 2 obesity due to excess calories without serious comorbidity with body mass index (BMI) of 39.0 to 39.9 in adult 01/02/2018    Past Surgical History:  Procedure Laterality Date   WISDOM TOOTH EXTRACTION  12/12/2017     Home Medications    Prior to Admission medications   Medication Sig Start Date End Date Taking? Authorizing Provider  ofloxacin (FLOXIN) 0.3 % OTIC solution Place 3 drops into the right ear 2 (two) times daily for 5 days. 05/09/23 05/14/23 Yes Areebah Meinders, Ivette Marks, PA-C  acetaminophen  (TYLENOL ) 500 MG tablet Take 500 mg by mouth every 6 (six) hours as needed. For pain relef    [provider]  Cetirizine HCl (ZYRTEC ALLERGY) 10 MG CAPS Take by mouth daily.    [provider]    Family History Family History  Problem Relation Age of Onset   Breast cancer Mother 29       Unsure if genetic testing was done   Congestive Heart Failure Paternal Grandmother     Social History Social History   Tobacco Use   Smoking status: Never   Smokeless tobacco: Never  Vaping Use   Vaping status: Never Used  Substance Use Topics   Alcohol use: Yes    Comment: 1-2 times a month   Drug use: No     Allergies   Patient has no known allergies.   Review of Systems Review of Systems Per HPI  Physical Exam Triage Vital Signs ED Triage Vitals  Encounter Vitals Group      BP      Systolic BP Percentile      Diastolic BP Percentile      Pulse      Resp      Temp      Temp src      SpO2      Weight      Height      Head Circumference      Peak Flow      Pain Score      Pain Loc      Pain Education      Exclude from Growth Chart    No data found.  Updated Vital Signs BP 123/86 (BP Location: Right Arm)   Pulse 81   Temp 98 F (36.7 C) (Oral)   Resp 16   SpO2 97%   Physical Exam Vitals and nursing note reviewed.  Constitutional:      Appearance: Normal appearance.  HENT:     Right Ear: Drainage and swelling present. No tenderness.     Left Ear: Hearing and tympanic membrane normal.     Ears:     Comments: Right canal with swelling and drainage, and some soft yellow material noted. There is no pain reported. The left canal and TM are normal    Mouth/Throat:  Mouth: Mucous membranes are moist.     Pharynx: Oropharynx is clear.  Eyes:     Extraocular Movements: Extraocular movements intact.     Conjunctiva/sclera: Conjunctivae normal.     Pupils: Pupils are equal, round, and reactive to light.  Cardiovascular:     Rate and Rhythm: Normal rate and regular rhythm.     Heart sounds: Normal heart sounds.  Pulmonary:     Effort: Pulmonary effort is normal.     Breath sounds: Normal breath sounds.  Musculoskeletal:     Cervical back: Normal range of motion.  Skin:    General: Skin is warm and dry.  Neurological:     Mental Status: He is alert and oriented to person, place, and time.     UC Treatments / Results  Labs (all labs ordered are listed, but only abnormal results are displayed) Labs Reviewed - No data to display  EKG  Radiology No results found.  Procedures Procedures  Medications Ordered in UC Medications - No data to display  Initial Impression / Assessment and Plan / UC Course  I have reviewed the triage vital signs and the nursing notes.  Pertinent labs & imaging results that were available during my care  of the patient were reviewed by me and considered in my medical decision making (see chart for details).  Right ear irrigation attempted to clear any wax from the ear. A portion of tissue came out per CMA; patient then admits to stuffing a tissue in his ear over the last few days.  Discussed with patient he likely has eustachian tube dysfunction causing ear fullness; and the foreign body has caused ear canal infection. Treat with daily nasal spray (flonase) and ofloxacin drops BID x 5 days. Advised not putting anything into the ears. Patient agrees to plan, no questions   Final Clinical Impressions(s) / UC Diagnoses   Final diagnoses:  Eustachian tube dysfunction, bilateral  Acute otitis externa of right ear, unspecified type     Discharge Instructions      Flonase nasal spray once daily to help with ear pressure/fullness  Use the ofloxacin ear drops in the right ear twice daily for 5 days in a row. You likely have an ear canal infection from a foreign body (tissue)   ED Prescriptions     Medication Sig Dispense Auth. Provider   ofloxacin (FLOXIN) 0.3 % OTIC solution Place 3 drops into the right ear 2 (two) times daily for 5 days. 5 mL Lowella Kindley, Ivette Marks, PA-C      PDMP not reviewed this encounter.   Lucian Baswell, Ivette Marks, [place] 05/09/23 1558

## 2023-09-16 ENCOUNTER — Ambulatory Visit
Admission: RE | Admit: 2023-09-16 | Discharge: 2023-09-16 | Disposition: A | Discharge status: Home / Self Care | Attending: Physician Assistant | Admitting: Physician Assistant

## 2023-09-16 ENCOUNTER — Other Ambulatory Visit: Payer: Self-pay

## 2023-09-16 VITALS — BP 132/80 | HR 95 | Temp 98.4°F | Resp 19 | Ht 73.0 in | Wt 301.4 lb

## 2023-09-16 DIAGNOSIS — M79672 Pain in left foot: Secondary | ICD-10-CM | POA: Diagnosis not present

## 2023-09-16 NOTE — Discharge Instructions (Addendum)
 VISIT SUMMARY:  You came in today because of sharp pain in your left foot, specifically near your pinky toe. The pain has been present since the weekend and is worse when you put pressure on it, such as when walking or standing. You have not noticed any swelling or bruising, and you do not recall any recent injuries to the area. You mentioned that ibuprofen  provided some relief and that you also feel a tingling sensation below your pinky toe, extending to the fourth toe.  YOUR PLAN:  -LEFT FOOT SOFT TISSUE INJURY: You have a soft tissue injury in your left foot, likely due to overuse or improper foot placement. This means that the connective tissues in your foot may be strained or pulled. To help with the pain, you can take acetaminophen  or ibuprofen . Applying warm compresses can also help since the pain has lasted more than 48 hours. Wearing a flat bottom shoe will prevent your toes from flexing and provide support to your foot. If your symptoms do not improve or get worse in the next 2-4 weeks, you should follow up with an orthopedic specialist.  INSTRUCTIONS:  Follow up with an orthopedic specialist if your symptoms do not improve or worsen over the next 2-4 weeks.

## 2023-09-16 NOTE — ED Provider Notes (Signed)
 [facility]    CSN: [id number] Arrival date & time: 09/16/23  1631      History   Chief Complaint Chief Complaint  Patient presents with   Foot Pain    Sharp pain in left sole of foot - Entered by patient    HPI Steven Braun is a 38 y.o. male.   HPI  History reviewed. No pertinent past medical history.  Patient Active Problem List   Diagnosis Date Noted   Hyperlipidemia 01/02/2018   Class 2 obesity due to excess calories without serious comorbidity with body mass index (BMI) of 39.0 to 39.9 in adult 01/02/2018    Past Surgical History:  Procedure Laterality Date   WISDOM TOOTH EXTRACTION  12/12/2017       Home Medications    Prior to Admission medications   Medication Sig Start Date End Date Taking? Authorizing Provider  acetaminophen  (TYLENOL ) 500 MG tablet Take 500 mg by mouth every 6 (six) hours as needed. For pain relef    [provider]  Cetirizine HCl (ZYRTEC ALLERGY) 10 MG CAPS Take by mouth daily.    [provider]    Family History Family History  Problem Relation Age of Onset   Breast cancer Mother 41       Unsure if genetic testing was done   Congestive Heart Failure Paternal Grandmother     Social History Social History   Tobacco Use   Smoking status: Never   Smokeless tobacco: Never  Vaping Use   Vaping status: Never Used  Substance Use Topics   Alcohol use: Yes    Comment: 1-2 times a month   Drug use: No     Allergies   Patient has no known allergies.   Review of Systems Review of Systems  Musculoskeletal:        Left foot pain       Physical Exam Triage Vital Signs ED Triage Vitals  Encounter Vitals Group     BP 09/16/23 1655 132/80     Girls Systolic BP Percentile --      Girls Diastolic BP Percentile --      Boys Systolic BP Percentile --      Boys Diastolic BP Percentile --      Pulse Rate 09/16/23 1655 95     Resp 09/16/23 1655 19     Temp 09/16/23 1655 98.4 F (36.9 C)      Temp Source 09/16/23 1655 Oral     SpO2 09/16/23 1655 94 %     Weight 09/16/23 1657 (!) 301 lb 5.9 oz (136.7 kg)     Height 09/16/23 1657 6' 1 (1.854 m)     Head Circumference --      Peak Flow --      Pain Score 09/16/23 1656 4     Pain Loc --      Pain Education --      Exclude from Growth Chart --    No data found.  Updated Vital Signs BP 132/80 (BP Location: Right Arm)   Pulse 95   Temp 98.4 F (36.9 C) (Oral)   Resp 19   Ht 6' 1 (1.854 m)   Wt (!) 301 lb 5.9 oz (136.7 kg)   SpO2 94%   BMI 39.76 kg/m   Visual Acuity Right Eye Distance:   Left Eye Distance:   Bilateral Distance:    Right Eye Near:   Left Eye Near:    Bilateral Near:  Physical Exam Vitals reviewed.  Constitutional:      General: He is awake.     Appearance: Normal appearance. He is well-developed and well-groomed.  HENT:     Head: Normocephalic and atraumatic.  Eyes:     Extraocular Movements: Extraocular movements intact.     Conjunctiva/sclera: Conjunctivae normal.  Pulmonary:     Effort: Pulmonary effort is normal.  Musculoskeletal:     Cervical back: Normal range of motion.  Feet:     Right foot:     Skin integrity: Skin integrity normal.     Left foot:     Skin integrity: Skin integrity normal.  Neurological:     Mental Status: He is alert and oriented to person, place, and time.  Psychiatric:        Attention and Perception: Attention normal.        Mood and Affect: Mood normal.        Speech: Speech normal.        Behavior: Behavior normal. Behavior is cooperative.      UC Treatments / Results  Labs (all labs ordered are listed, but only abnormal results are displayed) Labs Reviewed - No data to display  EKG   Radiology No results found.  Procedures Procedures (including critical care time)  Medications Ordered in UC Medications - No data to display  Initial Impression / Assessment and Plan / UC Course  I have reviewed the triage vital signs and the  nursing notes.  Pertinent labs & imaging results that were available during my care of the patient were reviewed by me and considered in my medical decision making (see chart for details).     *** Final Clinical Impressions(s) / UC Diagnoses   Final diagnoses:  None   Discharge Instructions   None    ED Prescriptions   None    PDMP not reviewed this encounter.

## 2023-09-16 NOTE — ED Triage Notes (Signed)
 Pt presents with a chief complaint of left foot pain. States this is day three of pain. Pain becomes more noticeable with weight-bearing activities like walking. Currently rates overall left foot pain a 4/10. Describes as a sharp sensation. OTC Ibuprofen  taken last night with some relief/improvement in pain. No known injuries.

## 2023-12-05 ENCOUNTER — Ambulatory Visit
Admission: EM | Admit: 2023-12-05 | Discharge: 2023-12-05 | Disposition: A | Discharge status: Home / Self Care | Attending: Emergency Medicine | Admitting: Emergency Medicine

## 2023-12-05 DIAGNOSIS — H6693 Otitis media, unspecified, bilateral: Secondary | ICD-10-CM | POA: Diagnosis not present

## 2023-12-05 MED ORDER — AMOXICILLIN 875 MG PO TABS: 875.0000 mg | ORAL_TABLET | Freq: Two times a day (BID) | ORAL | 0 refills | Status: AC

## 2023-12-05 NOTE — ED Triage Notes (Signed)
 Patient to Urgent Care with complaints of right sided ear pain and fullness.  Symptoms x2 days.  Used ear drops from previous prescription/ taking ibuprofen .

## 2023-12-05 NOTE — Discharge Instructions (Addendum)
 Take the amoxicillin as directed.  Follow up with your primary care provider.

## 2023-12-05 NOTE — ED Provider Notes (Signed)
 Steven Braun    CSN: [id number] Arrival date & time: 12/05/23  1325      History   Chief Complaint Chief Complaint  Patient presents with   Ear Fullness    HPI Steven Braun is a 38 y.o. male.  Patient presents with 2-day history of right ear pain.  No fever, ear drainage, sore throat, cough, shortness of breath.  He has been treating his symptoms with previously prescribed eardrops (ofloxacin  prescribed in May 2025) and ibuprofen .  The history is provided by the patient and medical records.    History reviewed. No pertinent past medical history.  Patient Active Problem List   Diagnosis Date Noted   Hyperlipidemia 01/02/2018   Class 2 obesity due to excess calories without serious comorbidity with body mass index (BMI) of 39.0 to 39.9 in adult 01/02/2018    Past Surgical History:  Procedure Laterality Date   WISDOM TOOTH EXTRACTION  12/12/2017       Home Medications    Prior to Admission medications   Medication Sig Start Date End Date Taking? Authorizing Provider  amoxicillin  (AMOXIL ) 875 MG tablet Take 1 tablet (875 mg total) by mouth 2 (two) times daily for 10 days. 12/05/23 12/15/23 Yes Corlis Burnard DEL, NP  acetaminophen  (TYLENOL ) 500 MG tablet Take 500 mg by mouth every 6 (six) hours as needed. For pain relef    [provider]  Cetirizine HCl (ZYRTEC ALLERGY) 10 MG CAPS Take by mouth daily.    [provider]    Family History Family History  Problem Relation Age of Onset   Breast cancer Mother 16       Unsure if genetic testing was done   Congestive Heart Failure Paternal Grandmother     Social History Social History   Tobacco Use   Smoking status: Never   Smokeless tobacco: Never  Vaping Use   Vaping status: Never Used  Substance Use Topics   Alcohol use: Yes    Comment: 1-2 times a month   Drug use: No     Allergies   Patient has no known allergies.   Review of Systems Review of Systems  Constitutional:   Negative for chills and fatigue.  HENT:  Positive for ear pain. Negative for ear discharge and sore throat.   Respiratory:  Negative for cough and shortness of breath.      Physical Exam Triage Vital Signs ED Triage Vitals [12/05/23 1343]  Encounter Vitals Group     BP      Girls Systolic BP Percentile      Girls Diastolic BP Percentile      Boys Systolic BP Percentile      Boys Diastolic BP Percentile      Pulse      Resp      Temp      Temp src      SpO2      Weight      Height      Head Circumference      Peak Flow      Pain Score 8     Pain Loc      Pain Education      Exclude from Growth Chart    No data found.  Updated Vital Signs BP 133/81   Pulse 83   Temp 98 F (36.7 C)   Resp 18   SpO2 99%   Visual Acuity Right Eye Distance:   Left Eye Distance:   Bilateral Distance:  Right Eye Near:   Left Eye Near:    Bilateral Near:     Physical Exam Constitutional:      General: He is not in acute distress. HENT:     Right Ear: Tympanic membrane is erythematous.     Left Ear: Tympanic membrane is erythematous.     Nose: Nose normal.     Mouth/Throat:     Mouth: Mucous membranes are moist.     Pharynx: Oropharynx is clear.  Cardiovascular:     Rate and Rhythm: Normal rate and regular rhythm.     Heart sounds: Normal heart sounds.  Pulmonary:     Effort: Pulmonary effort is normal. No respiratory distress.     Breath sounds: Normal breath sounds.  Neurological:     Mental Status: He is alert.      UC Treatments / Results  Labs (all labs ordered are listed, but only abnormal results are displayed) Labs Reviewed - No data to display  EKG   Radiology No results found.  Procedures Procedures (including critical care time)  Medications Ordered in UC Medications - No data to display  Initial Impression / Assessment and Plan / UC Course  I have reviewed the triage vital signs and the nursing notes.  Pertinent labs & imaging results that  were available during my care of the patient were reviewed by me and considered in my medical decision making (see chart for details).   Bilateral otitis media.  Afebrile and vital signs are stable.  Treating today with 10-day course of amoxicillin .  Tylenol  or ibuprofen  as needed.  Education provided on otitis media.  Instructed patient to follow-up with his PCP.  He agrees to plan of care.    Final Clinical Impressions(s) / UC Diagnoses   Final diagnoses:  Bilateral otitis media, unspecified otitis media type     Discharge Instructions      Take the amoxicillin  as directed.  Follow up with your primary care provider.        ED Prescriptions     Medication Sig Dispense Auth. Provider   amoxicillin  (AMOXIL ) 875 MG tablet Take 1 tablet (875 mg total) by mouth 2 (two) times daily for 10 days. 20 tablet Corlis Burnard DEL, NP      PDMP not reviewed this encounter.   Corlis Burnard DEL, NP 12/05/23 [id number]
